# Patient Record
Sex: Male | Born: 1990
Health system: Southern US, Community
[De-identification: ages and names within clinical notes are randomized; demographics above are authoritative.]

## PROBLEM LIST (undated history)

## (undated) DIAGNOSIS — I10 Essential (primary) hypertension: Secondary | ICD-10-CM

## (undated) DIAGNOSIS — M5430 Sciatica, unspecified side: Secondary | ICD-10-CM

## (undated) DIAGNOSIS — E079 Disorder of thyroid, unspecified: Secondary | ICD-10-CM

## (undated) DIAGNOSIS — Z87442 Personal history of urinary calculi: Secondary | ICD-10-CM

## (undated) DIAGNOSIS — E119 Type 2 diabetes mellitus without complications: Secondary | ICD-10-CM

## (undated) DIAGNOSIS — E7800 Pure hypercholesterolemia, unspecified: Secondary | ICD-10-CM

## (undated) DIAGNOSIS — G4730 Sleep apnea, unspecified: Secondary | ICD-10-CM

## (undated) DIAGNOSIS — F32A Depression, unspecified: Secondary | ICD-10-CM

## (undated) DIAGNOSIS — E039 Hypothyroidism, unspecified: Secondary | ICD-10-CM

## (undated) HISTORY — PX: APPENDECTOMY: SHX54

## (undated) HISTORY — DX: Disorder of thyroid, unspecified: E07.9

---
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
## 2012-12-07 ENCOUNTER — Emergency Department (HOSPITAL_COMMUNITY): Payer: Self-pay

## 2012-12-07 ENCOUNTER — Observation Stay (HOSPITAL_COMMUNITY)
Admission: EM | Admit: 2012-12-07 | Discharge: 2012-12-08 | Disposition: A | Discharge status: Home / Self Care | Payer: Self-pay | Attending: General Surgery | Admitting: General Surgery

## 2012-12-07 ENCOUNTER — Encounter (HOSPITAL_COMMUNITY): Payer: Self-pay | Admitting: Emergency Medicine

## 2012-12-07 ENCOUNTER — Encounter (HOSPITAL_COMMUNITY)
Admission: EM | Disposition: A | Discharge status: Home / Self Care | Payer: Self-pay | Source: Home / Self Care | Attending: Emergency Medicine

## 2012-12-07 ENCOUNTER — Observation Stay (HOSPITAL_COMMUNITY): Payer: Self-pay | Admitting: Anesthesiology

## 2012-12-07 ENCOUNTER — Encounter (HOSPITAL_COMMUNITY): Payer: Self-pay | Admitting: Anesthesiology

## 2012-12-07 DIAGNOSIS — Z9049 Acquired absence of other specified parts of digestive tract: Secondary | ICD-10-CM

## 2012-12-07 DIAGNOSIS — K3580 Unspecified acute appendicitis: Secondary | ICD-10-CM

## 2012-12-07 DIAGNOSIS — R112 Nausea with vomiting, unspecified: Secondary | ICD-10-CM | POA: Insufficient documentation

## 2012-12-07 DIAGNOSIS — K35209 Acute appendicitis with generalized peritonitis, without abscess, unspecified as to perforation: Principal | ICD-10-CM | POA: Insufficient documentation

## 2012-12-07 DIAGNOSIS — K352 Acute appendicitis with generalized peritonitis, without abscess: Secondary | ICD-10-CM | POA: Insufficient documentation

## 2012-12-07 DIAGNOSIS — F17200 Nicotine dependence, unspecified, uncomplicated: Secondary | ICD-10-CM | POA: Insufficient documentation

## 2012-12-07 HISTORY — PX: LAPAROSCOPIC APPENDECTOMY: SHX408

## 2012-12-07 LAB — URINE MICROSCOPIC-ADD ON

## 2012-12-07 LAB — COMPREHENSIVE METABOLIC PANEL
ALT: 23 U/L (ref 0–53)
Alkaline Phosphatase: 76 U/L (ref 39–117)
BUN: 14 mg/dL (ref 6–23)
CO2: 25 mEq/L (ref 19–32)
Chloride: 103 mEq/L (ref 96–112)
GFR calc Af Amer: >90 mL/min (ref >90)
GFR calc non Af Amer: >90 mL/min (ref >90)
Glucose, Bld: 105 mg/dL — ABNORMAL HIGH (ref 70–99)
Potassium: 3.4 mEq/L — ABNORMAL LOW (ref 3.5–5.1)
Sodium: 139 mEq/L (ref 135–145)
Total Bilirubin: 0.5 mg/dL (ref 0.3–1.2)
Total Protein: 7 g/dL (ref 6.0–8.3)

## 2012-12-07 LAB — CBC WITH DIFFERENTIAL/PLATELET
Eosinophils Absolute: 0.1 10*3/uL (ref 0.0–0.7)
Hemoglobin: 14.7 g/dL (ref 13.0–17.0)
Lymphocytes Relative: 10 % — ABNORMAL LOW (ref 12–46)
Lymphs Abs: 1.8 10*3/uL (ref 0.7–4.0)
MCH: 31.7 pg (ref 26.0–34.0)
Monocytes Relative: 8 % (ref 3–12)
Neutrophils Relative %: 82 % — ABNORMAL HIGH (ref 43–77)
Platelets: 267 10*3/uL (ref 150–400)
RBC: 4.63 MIL/uL (ref 4.22–5.81)
WBC: 18.6 10*3/uL — ABNORMAL HIGH (ref 4.0–10.5)

## 2012-12-07 LAB — URINALYSIS, ROUTINE W REFLEX MICROSCOPIC
Bilirubin Urine: NEGATIVE
Hgb urine dipstick: NEGATIVE
Ketones, ur: NEGATIVE mg/dL
Nitrite: NEGATIVE
Protein, ur: NEGATIVE mg/dL
Specific Gravity, Urine: 1.015 (ref 1.005–1.030)
Urobilinogen, UA: 0.2 mg/dL (ref 0.0–1.0)

## 2012-12-07 LAB — LIPASE, BLOOD: Lipase: 19 U/L (ref 11–59)

## 2012-12-07 SURGERY — APPENDECTOMY, LAPAROSCOPIC
Anesthesia: General | Site: Abdomen | Wound class: Contaminated

## 2012-12-07 MED ORDER — SODIUM CHLORIDE 0.9 % IV SOLN
3.0000 g | Freq: Four times a day (QID) | INTRAVENOUS | Status: DC
  Administered 2012-12-07 – 2012-12-08 (×5): 3 g via INTRAVENOUS
  Filled 2012-12-07 (×7): qty 3

## 2012-12-07 MED ORDER — HYDROMORPHONE HCL PF 1 MG/ML IJ SOLN
INTRAMUSCULAR | Status: AC
  Administered 2012-12-07: 0.5 mg via INTRAVENOUS
  Filled 2012-12-07: qty 1

## 2012-12-07 MED ORDER — IOHEXOL 300 MG/ML  SOLN
25.0000 mL | INTRAMUSCULAR | Status: DC
  Administered 2012-12-07: 25 mL via ORAL

## 2012-12-07 MED ORDER — PROMETHAZINE HCL 25 MG/ML IJ SOLN: 6.2500 mg | INTRAMUSCULAR | Status: DC | PRN

## 2012-12-07 MED ORDER — MIDAZOLAM HCL 5 MG/5ML IJ SOLN
INTRAMUSCULAR | Status: DC | PRN
  Administered 2012-12-07: 1 mg via INTRAVENOUS

## 2012-12-07 MED ORDER — BUPIVACAINE-EPINEPHRINE PF 0.25-1:200000 % IJ SOLN
INTRAMUSCULAR | Status: AC
  Filled 2012-12-07: qty 30

## 2012-12-07 MED ORDER — ENOXAPARIN SODIUM 40 MG/0.4ML ~~LOC~~ SOLN
40.0000 mg | SUBCUTANEOUS | Status: DC
  Administered 2012-12-07: 40 mg via SUBCUTANEOUS
  Filled 2012-12-07 (×2): qty 0.4

## 2012-12-07 MED ORDER — 0.9 % SODIUM CHLORIDE (POUR BTL) OPTIME
TOPICAL | Status: DC | PRN
  Administered 2012-12-07: 1000 mL

## 2012-12-07 MED ORDER — ROCURONIUM BROMIDE 100 MG/10ML IV SOLN
INTRAVENOUS | Status: DC | PRN
  Administered 2012-12-07: 40 mg via INTRAVENOUS

## 2012-12-07 MED ORDER — ALBUTEROL SULFATE HFA 108 (90 BASE) MCG/ACT IN AERS
INHALATION_SPRAY | RESPIRATORY_TRACT | Status: DC | PRN
  Administered 2012-12-07: 2 via RESPIRATORY_TRACT

## 2012-12-07 MED ORDER — ONDANSETRON HCL 4 MG PO TABS: 4.0000 mg | ORAL_TABLET | Freq: Four times a day (QID) | ORAL | Status: DC | PRN

## 2012-12-07 MED ORDER — IBUPROFEN 600 MG PO TABS
600.0000 mg | ORAL_TABLET | Freq: Four times a day (QID) | ORAL | Status: DC | PRN
  Filled 2012-12-07: qty 1

## 2012-12-07 MED ORDER — LACTATED RINGERS IV SOLN
INTRAVENOUS | Status: DC | PRN
  Administered 2012-12-07: 10:00:00 via INTRAVENOUS

## 2012-12-07 MED ORDER — MORPHINE SULFATE 4 MG/ML IJ SOLN
4.0000 mg | Freq: Once | INTRAMUSCULAR | Status: AC
  Administered 2012-12-07: 4 mg via INTRAVENOUS
  Filled 2012-12-07: qty 1

## 2012-12-07 MED ORDER — ONDANSETRON HCL 4 MG/2ML IJ SOLN
INTRAMUSCULAR | Status: DC | PRN
  Administered 2012-12-07: 4 mg via INTRAVENOUS

## 2012-12-07 MED ORDER — MORPHINE SULFATE 2 MG/ML IJ SOLN: 1.0000 mg | INTRAMUSCULAR | Status: DC | PRN

## 2012-12-07 MED ORDER — OXYCODONE HCL 5 MG PO TABS: 5.0000 mg | ORAL_TABLET | Freq: Once | ORAL | Status: DC | PRN

## 2012-12-07 MED ORDER — HYDROMORPHONE HCL PF 1 MG/ML IJ SOLN: 0.2500 mg | INTRAMUSCULAR | Status: DC | PRN

## 2012-12-07 MED ORDER — FENTANYL CITRATE 0.05 MG/ML IJ SOLN
INTRAMUSCULAR | Status: DC | PRN
  Administered 2012-12-07: 250 ug via INTRAVENOUS

## 2012-12-07 MED ORDER — IOHEXOL 300 MG/ML  SOLN
100.0000 mL | Freq: Once | INTRAMUSCULAR | Status: AC | PRN
  Administered 2012-12-07: 100 mL via INTRAVENOUS

## 2012-12-07 MED ORDER — ONDANSETRON HCL 4 MG/2ML IJ SOLN
4.0000 mg | Freq: Once | INTRAMUSCULAR | Status: AC
  Administered 2012-12-07: 4 mg via INTRAVENOUS
  Filled 2012-12-07: qty 2

## 2012-12-07 MED ORDER — OXYCODONE HCL 5 MG/5ML PO SOLN: 5.0000 mg | Freq: Once | ORAL | Status: DC | PRN

## 2012-12-07 MED ORDER — ONDANSETRON HCL 4 MG/2ML IJ SOLN: 4.0000 mg | Freq: Four times a day (QID) | INTRAMUSCULAR | Status: DC | PRN

## 2012-12-07 MED ORDER — KCL IN DEXTROSE-NACL 20-5-0.45 MEQ/L-%-% IV SOLN
INTRAVENOUS | Status: DC
  Administered 2012-12-07: 21:00:00 via INTRAVENOUS
  Filled 2012-12-07 (×3): qty 1000

## 2012-12-07 MED ORDER — SUCCINYLCHOLINE CHLORIDE 20 MG/ML IJ SOLN
INTRAMUSCULAR | Status: DC | PRN
  Administered 2012-12-07: 100 mg via INTRAVENOUS

## 2012-12-07 MED ORDER — HYDROMORPHONE HCL PF 1 MG/ML IJ SOLN
0.2500 mg | INTRAMUSCULAR | Status: DC | PRN
  Administered 2012-12-07: 0.5 mg via INTRAVENOUS

## 2012-12-07 MED ORDER — LIDOCAINE HCL (CARDIAC) 20 MG/ML IV SOLN
INTRAVENOUS | Status: DC | PRN
  Administered 2012-12-07: 100 mg via INTRAVENOUS

## 2012-12-07 MED ORDER — OXYCODONE-ACETAMINOPHEN 5-325 MG PO TABS
1.0000 | ORAL_TABLET | ORAL | Status: DC | PRN
  Administered 2012-12-08 (×2): 2 via ORAL
  Filled 2012-12-07 (×2): qty 2

## 2012-12-07 MED ORDER — DEXTROSE-NACL 5-0.9 % IV SOLN: INTRAVENOUS | Status: DC

## 2012-12-07 MED ORDER — DEXAMETHASONE SODIUM PHOSPHATE 10 MG/ML IJ SOLN
INTRAMUSCULAR | Status: DC | PRN
  Administered 2012-12-07: 4 mg via INTRAVENOUS

## 2012-12-07 MED ORDER — HYDROMORPHONE HCL PF 1 MG/ML IJ SOLN
1.0000 mg | INTRAMUSCULAR | Status: DC | PRN
  Administered 2012-12-07 (×2): 1 mg via INTRAVENOUS
  Filled 2012-12-07 (×2): qty 1

## 2012-12-07 MED ORDER — NEOSTIGMINE METHYLSULFATE 1 MG/ML IJ SOLN
INTRAMUSCULAR | Status: DC | PRN
  Administered 2012-12-07: 3 mg via INTRAVENOUS

## 2012-12-07 MED ORDER — PROPOFOL 10 MG/ML IV BOLUS
INTRAVENOUS | Status: DC | PRN
  Administered 2012-12-07: 200 mg via INTRAVENOUS

## 2012-12-07 MED ORDER — GLYCOPYRROLATE 0.2 MG/ML IJ SOLN
INTRAMUSCULAR | Status: DC | PRN
  Administered 2012-12-07: 0.4 mg via INTRAVENOUS

## 2012-12-07 MED ORDER — BUPIVACAINE-EPINEPHRINE 0.25% -1:200000 IJ SOLN
INTRAMUSCULAR | Status: DC | PRN
  Administered 2012-12-07: 21 mL

## 2012-12-07 SURGICAL SUPPLY — 48 items
APPLIER CLIP ROT 10 11.4 M/L (STAPLE)
BANDAGE ADHESIVE 1X3 (GAUZE/BANDAGES/DRESSINGS) ×2 IMPLANT
BENZOIN TINCTURE PRP APPL 2/3 (GAUZE/BANDAGES/DRESSINGS) IMPLANT
BLADE CLIPPER SURG NEURO (BLADE) ×2 IMPLANT
CANISTER SUCTION 2500CC (MISCELLANEOUS) ×2 IMPLANT
CHLORAPREP W/TINT 26ML (MISCELLANEOUS) ×2 IMPLANT
CLIP APPLIE ROT 10 11.4 M/L (STAPLE) IMPLANT
CLOTH BEACON ORANGE TIMEOUT ST (SAFETY) ×2 IMPLANT
COVER SURGICAL LIGHT HANDLE (MISCELLANEOUS) ×2 IMPLANT
CUTTER FLEX LINEAR 45M (STAPLE) ×2 IMPLANT
DECANTER SPIKE VIAL GLASS SM (MISCELLANEOUS) ×2 IMPLANT
DERMABOND ADVANCED (GAUZE/BANDAGES/DRESSINGS)
DERMABOND ADVANCED .7 DNX12 (GAUZE/BANDAGES/DRESSINGS) IMPLANT
DRAPE UTILITY 15X26 W/TAPE STR (DRAPE) ×4 IMPLANT
DRSG TEGADERM 2-3/8X2-3/4 SM (GAUZE/BANDAGES/DRESSINGS) ×2 IMPLANT
ELECT REM PT RETURN 9FT ADLT (ELECTROSURGICAL) ×2
ELECTRODE REM PT RTRN 9FT ADLT (ELECTROSURGICAL) ×1 IMPLANT
ENDOLOOP SUT PDS II  0 18 (SUTURE)
ENDOLOOP SUT PDS II 0 18 (SUTURE) IMPLANT
GAUZE SPONGE 2X2 8PLY STRL LF (GAUZE/BANDAGES/DRESSINGS) ×1 IMPLANT
GLOVE BIOGEL M STRL SZ7.5 (GLOVE) ×2 IMPLANT
GLOVE BIOGEL PI IND STRL 7.0 (GLOVE) ×1 IMPLANT
GLOVE BIOGEL PI IND STRL 8 (GLOVE) ×1 IMPLANT
GLOVE BIOGEL PI INDICATOR 7.0 (GLOVE) ×1
GLOVE BIOGEL PI INDICATOR 8 (GLOVE) ×1
GLOVE SURG SS PI 7.0 STRL IVOR (GLOVE) ×2 IMPLANT
GOWN STRL NON-REIN LRG LVL3 (GOWN DISPOSABLE) ×4 IMPLANT
GOWN STRL REIN XL XLG (GOWN DISPOSABLE) ×2 IMPLANT
KIT BASIN OR (CUSTOM PROCEDURE TRAY) ×2 IMPLANT
KIT ROOM TURNOVER OR (KITS) ×2 IMPLANT
NS IRRIG 1000ML POUR BTL (IV SOLUTION) ×2 IMPLANT
PAD ARMBOARD 7.5X6 YLW CONV (MISCELLANEOUS) ×4 IMPLANT
POUCH SPECIMEN RETRIEVAL 10MM (ENDOMECHANICALS) ×2 IMPLANT
RELOAD 45 VASCULAR/THIN (ENDOMECHANICALS) ×2 IMPLANT
RELOAD STAPLE TA45 3.5 REG BLU (ENDOMECHANICALS) IMPLANT
SCALPEL HARMONIC ACE (MISCELLANEOUS) ×2 IMPLANT
SCISSORS LAP 5X35 DISP (ENDOMECHANICALS) IMPLANT
SET IRRIG TUBING LAPAROSCOPIC (IRRIGATION / IRRIGATOR) ×2 IMPLANT
SPECIMEN JAR SMALL (MISCELLANEOUS) ×2 IMPLANT
SPONGE GAUZE 2X2 STER 10/PKG (GAUZE/BANDAGES/DRESSINGS) ×1
SUT MNCRL AB 4-0 PS2 18 (SUTURE) ×2 IMPLANT
SUT VICRYL 0 UR6 27IN ABS (SUTURE) IMPLANT
TOWEL OR 17X24 6PK STRL BLUE (TOWEL DISPOSABLE) ×2 IMPLANT
TOWEL OR 17X26 10 PK STRL BLUE (TOWEL DISPOSABLE) ×2 IMPLANT
TRAY FOLEY CATH 16FR SILVER (SET/KITS/TRAYS/PACK) ×2 IMPLANT
TRAY LAPAROSCOPIC (CUSTOM PROCEDURE TRAY) ×2 IMPLANT
TROCAR XCEL BLADELESS 5X75MML (TROCAR) ×4 IMPLANT
TROCAR XCEL BLUNT TIP 100MML (ENDOMECHANICALS) ×2 IMPLANT

## 2012-12-07 NOTE — ED Notes (Signed)
Called CT personnel.  Advised that several pt's needed to be dosed and that there is a staff member coming around with contrast.

## 2012-12-07 NOTE — ED Provider Notes (Signed)
CSN: [id number]     Arrival date & time 12/07/12  0218 History   First MD Initiated Contact with Patient 12/07/12 [phone]     Chief Complaint  Patient presents with  . Abdominal Pain   (Consider location/radiation/quality/duration/timing/severity/associated sxs/prior Treatment) HPI Comments: Patient presents to the ER for abdominal pain. Patient reports that he was awakened from sleep around 1 AM with complaints of pain in his abdomen. Patient noticed pain in the right lower abdomen which has progressively worsened. She has had nausea and vomited one time. He has not noticed any fever. Patient has no diarrhea. Patient indicates that he had increased pain with jostling movements in the car on the way to the ER.  Patient is a 22 y.o. male presenting with abdominal pain.  Abdominal Pain Associated symptoms: vomiting     History reviewed. No pertinent past medical history. History reviewed. No pertinent past surgical history. No family history on file. History  Substance Use Topics  . Smoking status: Current Every Day Smoker  . Smokeless tobacco: Not on file  . Alcohol Use: Yes    Review of Systems  Gastrointestinal: Positive for vomiting and abdominal pain.  All other systems reviewed and are negative.    Allergies  Review of patient's allergies indicates no known allergies.  Home Medications   Current Outpatient Rx  Name  Route  Sig  Dispense  Refill  . naproxen sodium (ANAPROX) 220 MG tablet   Oral   Take 220 mg by mouth as needed (pain).          BP 132/74  Pulse 90  Temp(Src) 97.8 F (36.6 C) (Oral)  Resp 20  Ht 5\' 9"  (1.753 m)  Wt 200 lb (90.719 kg)  BMI 29.52 kg/m2  SpO2 100% Physical Exam  Constitutional: He is oriented to person, place, and time. He appears well-developed and well-nourished. No distress.  HENT:  Head: Normocephalic and atraumatic.  Right Ear: Hearing normal.  Left Ear: Hearing normal.  Nose: Nose normal.  Mouth/Throat: Oropharynx is  clear and moist and mucous membranes are normal.  Eyes: Conjunctivae and EOM are normal. Pupils are equal, round, and reactive to light.  Neck: Normal range of motion. Neck supple.  Cardiovascular: Regular rhythm, S1 normal and S2 normal.  Exam reveals no gallop and no friction rub.   No murmur heard. Pulmonary/Chest: Effort normal and breath sounds normal. No respiratory distress. He exhibits no tenderness.  Abdominal: Soft. Normal appearance and bowel sounds are normal. There is no hepatosplenomegaly. There is tenderness in the right lower quadrant. There is rebound and guarding. There is no tenderness at McBurney's point and negative Murphy's sign. No hernia.  Rovsing  Musculoskeletal: Normal range of motion.  Neurological: He is alert and oriented to person, place, and time. He has normal strength. No cranial nerve deficit or sensory deficit. Coordination normal. GCS eye subscore is 4. GCS verbal subscore is 5. GCS motor subscore is 6.  Skin: Skin is warm, dry and intact. No rash noted. No cyanosis.  Psychiatric: He has a normal mood and affect. His speech is normal and behavior is normal. Thought content normal.    ED Course  Procedures (including critical care time) Labs Review Labs Reviewed  CBC WITH DIFFERENTIAL - Abnormal; Notable for the following:    WBC 18.6 (*)    Neutrophils Relative % 82 (*)    Neutro Abs 15.2 (*)    Lymphocytes Relative 10 (*)    Monocytes Absolute 1.4 (*)  All other components within normal limits  COMPREHENSIVE METABOLIC PANEL - Abnormal; Notable for the following:    Potassium 3.4 (*)    Glucose, Bld 105 (*)    All other components within normal limits  LIPASE, BLOOD  URINALYSIS, ROUTINE W REFLEX MICROSCOPIC   Imaging Review Ct Abdomen Pelvis W Contrast  12/07/2012   CLINICAL DATA:  Right lower quadrant pain the  EXAM: CT ABDOMEN AND PELVIS WITH CONTRAST  TECHNIQUE: Multidetector CT imaging of the abdomen and pelvis was performed using the  standard protocol following bolus administration of intravenous contrast.  CONTRAST:  OMNIPAQUE IOHEXOL 300 MG/ML  SOLN  COMPARISON:  None.  FINDINGS: BODY WALL: Unremarkable.  LOWER CHEST:  Mediastinum: Unremarkable.  Lungs/pleura: No consolidation.  ABDOMEN/PELVIS:  Liver: No focal abnormality.  Biliary: No evidence of biliary obstruction or stone.  Pancreas: Unremarkable.  Spleen: Unremarkable.  Adrenals: Unremarkable.  Kidneys and ureters: No hydronephrosis or stone.  Bladder: Unremarkable.  Bowel: The appendix is mildly dilated up to 8 mm, with avid wall enhancement and periappendiceal fat stranding. Small appendicolith present distally. No evidence of necrosis or perforation.  Retroperitoneum: No mass or adenopathy.  Peritoneum: Small volume free pelvic fluid, considered reactive.  Reproductive: Unremarkable.  Vascular: No acute abnormality.  OSSEOUS: No acute abnormalities. No suspicious lytic or blastic lesions.  CriticalValue/emergent results were called by telephone at the time of interpretation on 12/07/2012 at 5:36 [facility] , who verbally acknowledged these results.  IMPRESSION: Acute appendicitis.  No perforation.   Electronically Signed   By: Tiburcio Pea   On: 12/07/2012 05:37    MDM  Diagnosis: Acute appendicitis  Patient presents to the ER for evaluation of right-sided abdominal pain. Symptoms began approximately 1 AM and progressively worsened. Examination revealed significant tenderness and guarding in the right lower quadrant. He had mild rebound and a Rovsing. Acute appendicitis was considered the most likely diagnosis, CAT scan to confirm the diagnosis. General surgery consulted, will see the patient in the ER.    Gilda Crease, MD 12/07/12 [phone]

## 2012-12-07 NOTE — H&P (Signed)
Jordan Barnett is an 22 y.o. male.   Chief Complaint: RLQ abdominal pain HPI: 1 day hx of abdominal pain,  Location RLQ.  Intensity severe sharp 8/10 constant.  Made worse with movement.  Nausea.  Feels feverish.  CT acute appendicitis.   History reviewed. No pertinent past medical history.  History reviewed. No pertinent past surgical history.  No family history on file. Social History:  reports that he has been smoking.  He does not have any smokeless tobacco history on file. He reports that  drinks alcohol. He reports that he does not use illicit drugs.  Allergies: No Known Allergies   (Not in a hospital admission)  Results for orders placed during the hospital encounter of 12/07/12 (from the past 48 hour(s))  CBC WITH DIFFERENTIAL     Status: Abnormal   Collection Time    12/07/12  2:30 AM      Result Value Range   WBC 18.6 (*) 4.0 - 10.5 K/uL   RBC 4.63  4.22 - 5.81 MIL/uL   Hemoglobin 14.7  13.0 - 17.0 g/dL   HCT 40.9  81.1 - 91.4 %   MCV 90.1  78.0 - 100.0 fL   MCH 31.7  26.0 - 34.0 pg   MCHC 35.3  30.0 - 36.0 g/dL   RDW 78.2  95.6 - 21.3 %   Platelets 267  150 - 400 K/uL   Neutrophils Relative % 82 (*) 43 - 77 %   Neutro Abs 15.2 (*) 1.7 - 7.7 K/uL   Lymphocytes Relative 10 (*) 12 - 46 %   Lymphs Abs 1.8  0.7 - 4.0 K/uL   Monocytes Relative 8  3 - 12 %   Monocytes Absolute 1.4 (*) 0.1 - 1.0 K/uL   Eosinophils Relative 1  0 - 5 %   Eosinophils Absolute 0.1  0.0 - 0.7 K/uL   Basophils Relative 0  0 - 1 %   Basophils Absolute 0.0  0.0 - 0.1 K/uL  COMPREHENSIVE METABOLIC PANEL     Status: Abnormal   Collection Time    12/07/12  2:30 AM      Result Value Range   Sodium 139  135 - 145 mEq/L   Potassium 3.4 (*) 3.5 - 5.1 mEq/L   Chloride 103  96 - 112 mEq/L   CO2 25  19 - 32 mEq/L   Glucose, Bld 105 (*) 70 - 99 mg/dL   BUN 14  6 - 23 mg/dL   Creatinine, Ser 0.86  0.50 - 1.35 mg/dL   Calcium 8.7  8.4 - 57.8 mg/dL   Total Protein 7.0  6.0 - 8.3 g/dL   Albumin 4.3   3.5 - 5.2 g/dL   AST 20  0 - 37 U/L   ALT 23  0 - 53 U/L   Alkaline Phosphatase 76  39 - 117 U/L   Total Bilirubin 0.5  0.3 - 1.2 mg/dL   GFR calc non Af Amer >90  >90 mL/min   GFR calc Af Amer >90  >90 mL/min   Comment: (NOTE)     The eGFR has been calculated using the CKD EPI equation.     This calculation has not been validated in all clinical situations.     eGFR's persistently <90 mL/min signify possible Chronic Kidney     Disease.  LIPASE, BLOOD     Status: None   Collection Time    12/07/12  2:30 AM      Result Value Range  Lipase 19  11 - 59 U/L  URINALYSIS, ROUTINE W REFLEX MICROSCOPIC     Status: Abnormal   Collection Time    12/07/12  5:13 AM      Result Value Range   Color, Urine YELLOW  YELLOW   APPearance CLEAR  CLEAR   Specific Gravity, Urine 1.015  1.005 - 1.030   pH 5.5  5.0 - 8.0   Glucose, UA NEGATIVE  NEGATIVE mg/dL   Hgb urine dipstick NEGATIVE  NEGATIVE   Bilirubin Urine NEGATIVE  NEGATIVE   Ketones, ur NEGATIVE  NEGATIVE mg/dL   Protein, ur NEGATIVE  NEGATIVE mg/dL   Urobilinogen, UA 0.2  0.0 - 1.0 mg/dL   Nitrite NEGATIVE  NEGATIVE   Leukocytes, UA TRACE (*) NEGATIVE  URINE MICROSCOPIC-ADD ON     Status: None   Collection Time    12/07/12  5:13 AM      Result Value Range   Squamous Epithelial / LPF RARE  RARE   WBC, UA 0-2  <3 WBC/hpf   Bacteria, UA RARE  RARE   Ct Abdomen Pelvis W Contrast  12/07/2012   CLINICAL DATA:  Right lower quadrant pain the  EXAM: CT ABDOMEN AND PELVIS WITH CONTRAST  TECHNIQUE: Multidetector CT imaging of the abdomen and pelvis was performed using the standard protocol following bolus administration of intravenous contrast.  CONTRAST:  OMNIPAQUE IOHEXOL 300 MG/ML  SOLN  COMPARISON:  None.  FINDINGS: BODY WALL: Unremarkable.  LOWER CHEST:  Mediastinum: Unremarkable.  Lungs/pleura: No consolidation.  ABDOMEN/PELVIS:  Liver: No focal abnormality.  Biliary: No evidence of biliary obstruction or stone.  Pancreas:  Unremarkable.  Spleen: Unremarkable.  Adrenals: Unremarkable.  Kidneys and ureters: No hydronephrosis or stone.  Bladder: Unremarkable.  Bowel: The appendix is mildly dilated up to 8 mm, with avid wall enhancement and periappendiceal fat stranding. Small appendicolith present distally. No evidence of necrosis or perforation.  Retroperitoneum: No mass or adenopathy.  Peritoneum: Small volume free pelvic fluid, considered reactive.  Reproductive: Unremarkable.  Vascular: No acute abnormality.  OSSEOUS: No acute abnormalities. No suspicious lytic or blastic lesions.  CriticalValue/emergent results were called by telephone at the time of interpretation on 12/07/2012 at 5:36 [facility] , who verbally acknowledged these results.  IMPRESSION: Acute appendicitis.  No perforation.   Electronically Signed   By: Tiburcio Pea   On: 12/07/2012 05:37    Review of Systems  Constitutional: Positive for fever and chills.  HENT: Negative.   Eyes: Negative.   Cardiovascular: Negative.   Gastrointestinal: Positive for nausea and abdominal pain.  Genitourinary: Negative.   Musculoskeletal: Negative.   Skin: Negative.   Neurological: Positive for weakness.  Endo/Heme/Allergies: Negative.   Psychiatric/Behavioral: Negative.     Blood pressure 128/69, pulse 73, temperature 97.8 F (36.6 C), temperature source Oral, resp. rate 20, height 5\' 9"  (1.753 m), weight 200 lb (90.719 kg), SpO2 99.00%. Physical Exam  Constitutional: He is oriented to person, place, and time. He appears well-developed and well-nourished.  HENT:  Head: Normocephalic and atraumatic.  Eyes: EOM are normal. Pupils are equal, round, and reactive to light.  Neck: Normal range of motion. Neck supple.  Cardiovascular: Normal rate and regular rhythm.   Respiratory: Effort normal and breath sounds normal.  GI: He exhibits no distension. There is tenderness in the right lower quadrant. There is rebound and tenderness at McBurney's  point. No hernia.  Musculoskeletal: Normal range of motion.  Neurological: He is alert and oriented to person, place,  and time.  Skin: Skin is warm and dry.  Psychiatric: He has a normal mood and affect. His behavior is normal. Judgment and thought content normal.     Assessment/Plan Acute appendicitis Admit / IVF/ABX   Dr Andrey Campanile to see and decide on timing of surgery.  Given tenderness,  Laparoscopic appendectomy best choice over medical management. Discussed with patient and family.  Jordan Barnett A. 12/07/2012, 6:28 AM

## 2012-12-07 NOTE — Op Note (Signed)
Appendectomy, Lap, Procedure Note  Indications: The patient presented with a history of right-sided abdominal pain. A CT revealed findings consistent with acute appendicitis.  Pre-operative Diagnosis: Acute appendicitis with generalized peritonitis  Post-operative Diagnosis: Same  Surgeon: Atilano Ina   Assistants: none  Anesthesia: General endotracheal anesthesia  ASA Class: 1, E  Procedure Details  The patient was seen again in the Holding Room. The risks, benefits, complications, treatment options, and expected outcomes were discussed with the patient and/or family. The possibilities of perforation of viscus, bleeding, recurrent infection, the need for additional procedures, failure to diagnose a condition, and creating a complication requiring transfusion or operation were discussed. There was concurrence with the proposed plan and informed consent was obtained. The site of surgery was properly noted. The patient was taken to Operating Room, identified as Giordan Fordham and the procedure verified as Appendectomy. A Time Out was held and the above information confirmed.  The patient was placed in the supine position and general anesthesia was induced, along with placement of orogastric tube, SCDs, and a Foley catheter. The abdomen was prepped and draped in a sterile fashion. A 1.5 centimeter infraumbilical incision was made.  The umbilical stalk was elevated, and the midline fascia was incised with a #11 blade.  A Kelly clamp was used to confirm entrance into the peritoneal cavity.  A pursestring suture was passed around the incision with a 0 Vicryl.  A 12mm Hasson was introduced into the abdomen and the tails of the suture were used to hold the Hasson in place.   The pneumoperitoneum was then established to steady pressure of 15 mmHg.  Additional 5 mm cannulas then placed in the left lower quadrant of the abdomen and the suprapubic region under direct visualization. A careful evaluation of  the entire abdomen was carried out. The patient was placed in Trendelenburg and left lateral decubitus position. The small intestines were retracted in the cephalad and left lateral direction away from the pelvis and right lower quadrant. The patient was found to have an inflammed appendix that was extending into the pelvis. There was no evidence of perforation.  The appendix was carefully dissected. The appendix was was skeletonized with the harmonic scalpel.   The appendix was divided at its base using an endo-GIA stapler with a white load. No appendiceal stump was left in place. The appendix was removed from the abdomen with an Endocatch bag through the umbilical port.  There was no evidence of bleeding, leakage, or complication after division of the appendix. Irrigation was also performed and irrigate suctioned from the abdomen as well.  The umbilical port site was closed with the purse string suture. The closure was viewed laparoscopically. There was no residual palpable fascial defect.  The trocar site skin wounds were closed with 4-0 Monocryl. Benzoin, steri-strips, band-aids were applied. 2x2 gauze and tegaderm applied at umbilical incision.  Instrument, sponge, and needle counts were correct at the conclusion of the case.   Findings: The appendix was found to be inflamed. There were not signs of necrosis.  There was not perforation. There was not abscess formation.  Estimated Blood Loss:  Minimal         Drains: none         Specimens: appendix         Complications:  None; patient tolerated the procedure well.         Disposition: PACU - hemodynamically stable.         Condition: stable  Mary Sella.  Redmond Pulling, MD, FACS General, Bariatric, & Minimally Invasive Surgery [facility] Surgery, [place]

## 2012-12-07 NOTE — Anesthesia Postprocedure Evaluation (Signed)
Anesthesia Post Note  Patient: Jordan Barnett  Procedure(s) Performed: Procedure(s) (LRB): APPENDECTOMY LAPAROSCOPIC (N/A)  Anesthesia type: general  Patient location: PACU  Post pain: Pain level controlled  Post assessment: Patient's Cardiovascular Status Stable  Last Vitals:  Filed Vitals:   12/07/12 1230  BP: 105/55  Pulse: 63  Temp:   Resp: 19    Post vital signs: Reviewed and stable  Level of consciousness: sedated  Complications: No apparent anesthesia complications

## 2012-12-07 NOTE — Progress Notes (Signed)
Day of Surgery  Subjective: <1 day h/o abd pain, RLQ, n/v. Came to ED to get checked out. No prior history. O/w healthy. No prior surgery. +tob  Objective: Vital signs in last 24 hours: Temp:  [97.8 F (36.6 C)-98.4 F (36.9 C)] 98.4 F (36.9 C) (09/24 0805) Pulse Rate:  [57-90] 57 (09/24 0805) Resp:  [18-20] 18 (09/24 0805) BP: (116-147)/(59-81) 139/59 mmHg (09/24 0805) SpO2:  [96 %-100 %] 99 % (09/24 0805) Weight:  [200 lb (90.719 kg)] 200 lb (90.719 kg) (09/24 0223) Last BM Date: 12/06/12  Intake/Output from previous day:   Intake/Output this shift:    Alert, nad cta  Reg Pudgy, firm, RLQ & Suprapubic TTP. Localized peritonitis  Lab Results:   Recent Labs  12/07/12 0230  WBC 18.6*  HGB 14.7  HCT 41.7  PLT 267   BMET  Recent Labs  12/07/12 0230  NA 139  K 3.4*  CL 103  CO2 25  GLUCOSE 105*  BUN 14  CREATININE 1.06  CALCIUM 8.7   PT/INR No results found for this basename: LABPROT, INR,  in the last 72 hours ABG No results found for this basename: PHART, PCO2, PO2, HCO3,  in the last 72 hours  Studies/Results: Ct Abdomen Pelvis W Contrast  12/07/2012   CLINICAL DATA:  Right lower quadrant pain the  EXAM: CT ABDOMEN AND PELVIS WITH CONTRAST  TECHNIQUE: Multidetector CT imaging of the abdomen and pelvis was performed using the standard protocol following bolus administration of intravenous contrast.  CONTRAST:  OMNIPAQUE IOHEXOL 300 MG/ML  SOLN  COMPARISON:  None.  FINDINGS: BODY WALL: Unremarkable.  LOWER CHEST:  Mediastinum: Unremarkable.  Lungs/pleura: No consolidation.  ABDOMEN/PELVIS:  Liver: No focal abnormality.  Biliary: No evidence of biliary obstruction or stone.  Pancreas: Unremarkable.  Spleen: Unremarkable.  Adrenals: Unremarkable.  Kidneys and ureters: No hydronephrosis or stone.  Bladder: Unremarkable.  Bowel: The appendix is mildly dilated up to 8 mm, with avid wall enhancement and periappendiceal fat stranding. Small appendicolith present  distally. No evidence of necrosis or perforation.  Retroperitoneum: No mass or adenopathy.  Peritoneum: Small volume free pelvic fluid, considered reactive.  Reproductive: Unremarkable.  Vascular: No acute abnormality.  OSSEOUS: No acute abnormalities. No suspicious lytic or blastic lesions.  CriticalValue/emergent results were called by telephone at the time of interpretation on 12/07/2012 at 5:36 [facility] , who verbally acknowledged these results.  IMPRESSION: Acute appendicitis.  No perforation.   Electronically Signed   By: Tiburcio Pea   On: 12/07/2012 05:37    Anti-infectives: Anti-infectives   Start     Dose/Rate Route Frequency Ordered Stop   12/07/12 0900  Ampicillin-Sulbactam (UNASYN) 3 g in sodium chloride 0.9 % 100 mL IVPB     3 g 100 mL/hr over 60 Minutes Intravenous Every 6 hours 12/07/12 0819        Assessment/Plan: Acute appendicitis  To OR for lap appy  We discussed the etiology and management of acute appendicitis. We discussed operative and nonoperative management.  I recommended operative management along with IV antibiotics.  We discussed laparoscopic appendectomy. We discussed the risk and benefits of surgery including but not limited to bleeding, infection, injury to surrounding structures, need to convert to an open procedure, blood clot formation, post operative abscess or wound infection, staple line complications such as leak or bleeding, hernia formation, post operative ileus, need for additional procedures, anesthesia complications, and the typical postoperative course. I explained that the patient should expect a good  improvement in their symptoms.  Mary Sella. Andrey Campanile, MD, FACS General, Bariatric, & Minimally Invasive Surgery [facility] Surgery, [place]   LOS: 0 days    Atilano Ina 12/07/2012

## 2012-12-07 NOTE — Transfer of Care (Signed)
Immediate Anesthesia Transfer of Care Note  Patient: Jordan Barnett  Procedure(s) Performed: Procedure(s): APPENDECTOMY LAPAROSCOPIC (N/A)  Patient Location: PACU  Anesthesia Type:General  Level of Consciousness: awake, alert  and oriented  Airway & Oxygen Therapy: Patient Spontanous Breathing  Post-op Assessment: Report given to PACU RN and Post -op Vital signs reviewed and stable  Post vital signs: Reviewed and stable  Complications: No apparent anesthesia complications

## 2012-12-07 NOTE — ED Notes (Signed)
CT notified that pt is finished with contrast.  They stated they will be here in approximately 20 minutes to get pt for scan.

## 2012-12-07 NOTE — Preoperative (Signed)
Beta Blockers   Reason not to administer Beta Blockers:Not Applicable 

## 2012-12-07 NOTE — ED Notes (Signed)
Pt. reports RLQ pain onset 1 am this morning with nausea and vomitted ( x1) , denies fever or diarrhea.

## 2012-12-07 NOTE — Anesthesia Preprocedure Evaluation (Addendum)
Anesthesia Evaluation  Patient identified by MRN, date of birth, ID band Patient awake    Reviewed: Allergy & Precautions, H&P , NPO status , Patient's Chart, lab work & pertinent test results  Airway Mallampati: II      Dental  (+) Teeth Intact   Pulmonary Current Smoker,  breath sounds clear to auscultation        Cardiovascular negative cardio ROS  Rhythm:regular Rate:Normal     Neuro/Psych negative neurological ROS  negative psych ROS   GI/Hepatic negative GI ROS, Neg liver ROS,   Endo/Other  negative endocrine ROS  Renal/GU negative Renal ROS     Musculoskeletal   Abdominal   Peds  Hematology   Anesthesia Other Findings   Reproductive/Obstetrics negative OB ROS                         Anesthesia Physical Anesthesia Plan  ASA: II and emergent  Anesthesia Plan:    Post-op Pain Management:    Induction: Intravenous  Airway Management Planned:   Additional Equipment:   Intra-op Plan:   Post-operative Plan: Extubation in OR  Informed Consent: I have reviewed the patients History and Physical, chart, labs and discussed the procedure including the risks, benefits and alternatives for the proposed anesthesia with the patient or authorized representative who has indicated his/her understanding and acceptance.   Dental Advisory Given  Plan Discussed with: CRNA, Anesthesiologist and Surgeon  Anesthesia Plan Comments:        Anesthesia Quick Evaluation

## 2012-12-08 ENCOUNTER — Encounter (HOSPITAL_COMMUNITY): Payer: Self-pay | Admitting: General Surgery

## 2012-12-08 MED ORDER — OXYCODONE-ACETAMINOPHEN 5-325 MG PO TABS: 1.0000 | ORAL_TABLET | ORAL | Status: DC | PRN

## 2012-12-08 MED ORDER — IBUPROFEN 600 MG PO TABS: 600.0000 mg | ORAL_TABLET | Freq: Four times a day (QID) | ORAL | Status: DC | PRN

## 2012-12-08 NOTE — Discharge Summary (Signed)
  Physician Discharge Summary  Patient ID: Jordan Barnett MRN: [id number] DOB/AGE: Nov 03, 1990 22 y.o.  Admit date: 12/07/2012 Discharge date: 12/08/2012  Admitting Diagnosis: Acute appendicitis   Discharge Diagnosis Patient Active Problem List   Diagnosis Date Noted  . Acute appendicitis without mention of peritonitis 12/07/2012  . S/P laparoscopic appendectomy 12/07/2012    Consultants none  Imaging: Ct Abdomen Pelvis W Contrast  12/07/2012   CLINICAL DATA:  Right lower quadrant pain the  EXAM: CT ABDOMEN AND PELVIS WITH CONTRAST  TECHNIQUE: Multidetector CT imaging of the abdomen and pelvis was performed using the standard protocol following bolus administration of intravenous contrast.  CONTRAST:  OMNIPAQUE IOHEXOL 300 MG/ML  SOLN  COMPARISON:  None.  FINDINGS: BODY WALL: Unremarkable.  LOWER CHEST:  Mediastinum: Unremarkable.  Lungs/pleura: No consolidation.  ABDOMEN/PELVIS:  Liver: No focal abnormality.  Biliary: No evidence of biliary obstruction or stone.  Pancreas: Unremarkable.  Spleen: Unremarkable.  Adrenals: Unremarkable.  Kidneys and ureters: No hydronephrosis or stone.  Bladder: Unremarkable.  Bowel: The appendix is mildly dilated up to 8 mm, with avid wall enhancement and periappendiceal fat stranding. Small appendicolith present distally. No evidence of necrosis or perforation.  Retroperitoneum: No mass or adenopathy.  Peritoneum: Small volume free pelvic fluid, considered reactive.  Reproductive: Unremarkable.  Vascular: No acute abnormality.  OSSEOUS: No acute abnormalities. No suspicious lytic or blastic lesions.  CriticalValue/emergent results were called by telephone at the time of interpretation on 12/07/2012 at 5:36 [facility] , who verbally acknowledged these results.  IMPRESSION: Acute appendicitis.  No perforation.   Electronically Signed   By: Tiburcio Pea   On: 12/07/2012 05:37    Procedures Laparoscopic Appendectomy Dr. Andrey Campanile  12/07/12  Hospital Course:  Jordan Barnett is a healthy male who presented to [facility] with RLQ abdominal pain .  Workup showed acute appendicitis.  Patient was admitted and underwent procedure listed above.  Tolerated procedure well and was transferred to the floor.  Diet was advanced as tolerated.  On POD #1, the patient was voiding well, tolerating diet, ambulating well, pain well controlled, vital signs stable, incisions c/d/i and felt stable for discharge home.  Patient will follow up in our office in 3 weeks and knows to call with questions or concerns.  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  Medication risks and benefits discussed.   Physical Exam: General:  Alert, NAD, pleasant, comfortable Abd:  Soft, ND, mild tenderness, incisions C/D/I    Medication List         ibuprofen 600 MG tablet  Commonly known as:  ADVIL,MOTRIN  Take 1 tablet (600 mg total) by mouth every 6 (six) hours as needed (mild pain).     naproxen sodium 220 MG tablet  Commonly known as:  ANAPROX  Take 220 mg by mouth as needed (pain).     oxyCODONE-acetaminophen 5-325 MG per tablet  Commonly known as:  PERCOCET/ROXICET  Take 1 tablet by mouth every 4 (four) hours as needed.             Follow-up Information   Follow up with [facility] On 01/03/2013. (arrive no later than 1:15pm for 1:45 appt )    Contact information:   [address]   [place] [address] [phone]       Signed: Ashok Norris, [facility] [phone]  12/08/2012, 11:02 AM

## 2012-12-08 NOTE — Discharge Summary (Signed)
Pt seen and examined Discussed d/c instructions  Jordan Barnett. Andrey Campanile, MD, FACS General, Bariatric, & Minimally Invasive Surgery [facility], [place]

## 2012-12-15 ENCOUNTER — Telehealth (INDEPENDENT_AMBULATORY_CARE_PROVIDER_SITE_OTHER): Payer: Self-pay

## 2012-12-15 NOTE — Telephone Encounter (Signed)
[facility] calling to verify Rx request for refill of Percocet for the patient.  No documentation to confirm request.  LMOV pt to call our office for medication refill.

## 2013-01-03 ENCOUNTER — Encounter (INDEPENDENT_AMBULATORY_CARE_PROVIDER_SITE_OTHER): Payer: Self-pay | Admitting: *Deleted

## 2013-01-03 ENCOUNTER — Encounter (INDEPENDENT_AMBULATORY_CARE_PROVIDER_SITE_OTHER): Payer: Self-pay

## 2013-01-03 ENCOUNTER — Ambulatory Visit (INDEPENDENT_AMBULATORY_CARE_PROVIDER_SITE_OTHER): Payer: Self-pay | Admitting: General Surgery

## 2013-01-03 VITALS — BP 128/86 | HR 84 | Temp 97.4°F | Resp 16 | Ht 69.0 in | Wt 199.0 lb

## 2013-01-03 DIAGNOSIS — L02219 Cutaneous abscess of trunk, unspecified: Secondary | ICD-10-CM

## 2013-01-03 DIAGNOSIS — L02214 Cutaneous abscess of groin: Secondary | ICD-10-CM

## 2013-01-03 DIAGNOSIS — B3789 Other sites of candidiasis: Secondary | ICD-10-CM

## 2013-01-03 DIAGNOSIS — K3580 Unspecified acute appendicitis: Secondary | ICD-10-CM

## 2013-01-03 MED ORDER — CEPHALEXIN 500 MG PO CAPS: 500.0000 mg | ORAL_CAPSULE | Freq: Four times a day (QID) | ORAL | Status: AC

## 2013-01-03 MED ORDER — OXYCODONE-ACETAMINOPHEN 10-650 MG PO TABS: 1.0000 | ORAL_TABLET | Freq: Four times a day (QID) | ORAL | Status: DC | PRN

## 2013-01-03 NOTE — Patient Instructions (Signed)
Remove packing on Thursday.  Do not Shower until packing removed.  Keep area clean and covered

## 2013-01-03 NOTE — Progress Notes (Signed)
AVIEN TAHA 1990-06-06 [id number] 01/03/2013   History of Present Illness: Jordan Barnett is a  22 y.o. male who presents today status post lap appy by Dr. Dr. Gaynelle Adu.  Pathology reveals acute appendicitis with serositis.  The patient has an improving appetite, having normal bowel movements, has good pain control.  He  is back to most normal activities. He c/o a lump in his right groin that started 3 days ago along with some yeast in this area as well.  He is using over the counter lotrimin cream.  No fevers or chills.   Physical Exam: Abd: soft, nontender, active bowel sounds, nondistended.  All incisions are well healed. Inguinal area with candida present.  He also has a small 2cm area of fluctuance in the right groin.  This is tender.  This area was cleaned and prepped.  8cc of 2% lidocaine with epi was used to anesthetize this area.  An 11 blade was used to make an elliptical incision.  A small amount of purulent drainage was evacuated.  Hemostasis was achieved and iodoform packing was placed.  The patient tolerated the procedure well.   Impression: 1.  Acute appendicitis, s/p lap appy 2. Right inguinal abscess, s/p I&D 3. Candida of groin  Plan: He  is able to return to normal activities. He should RTC in 3 weeks for a wound check.  He has been informed to remove his packing on Thursday and then no further packing needs to be completed.  He should keep it clean and dry.  He may continue using lotrimin for his candida; however, if this does not improve he should call and we can call him in some Nystatin powder.  He will take 7 days of Keflex for his abscess.

## 2013-01-24 ENCOUNTER — Other Ambulatory Visit (INDEPENDENT_AMBULATORY_CARE_PROVIDER_SITE_OTHER): Payer: Self-pay | Admitting: *Deleted

## 2013-01-24 ENCOUNTER — Encounter (INDEPENDENT_AMBULATORY_CARE_PROVIDER_SITE_OTHER): Payer: Self-pay | Admitting: *Deleted

## 2013-01-24 ENCOUNTER — Ambulatory Visit (INDEPENDENT_AMBULATORY_CARE_PROVIDER_SITE_OTHER): Payer: Self-pay | Admitting: General Surgery

## 2013-01-24 ENCOUNTER — Encounter (INDEPENDENT_AMBULATORY_CARE_PROVIDER_SITE_OTHER): Payer: Self-pay | Admitting: General Surgery

## 2013-01-24 VITALS — BP 132/86 | HR 102 | Temp 98.0°F | Resp 18 | Ht 69.0 in | Wt 201.0 lb

## 2013-01-24 DIAGNOSIS — L0291 Cutaneous abscess, unspecified: Secondary | ICD-10-CM

## 2013-01-24 DIAGNOSIS — L0390 Cellulitis, unspecified: Secondary | ICD-10-CM

## 2013-01-24 MED ORDER — SULFAMETHOXAZOLE-TRIMETHOPRIM 400-80 MG PO TABS: 1.0000 | ORAL_TABLET | Freq: Two times a day (BID) | ORAL | Status: AC

## 2013-01-24 MED ORDER — OXYCODONE-ACETAMINOPHEN 5-325 MG PO TABS: 1.0000 | ORAL_TABLET | ORAL | Status: DC | PRN

## 2013-01-24 NOTE — Progress Notes (Signed)
@  [email or web address]@  Subjective: Jordan Barnett is a 22 y.o. male who presents today status post lap appy by Dr. Dr. Gaynelle Adu. Pathology reveals acute appendicitis with serositis. The patient has an improving appetite, having normal bowel movements, has good pain control. He  is back to most normal activities. He c/o a lump in his right groin that started 3 days ago along with some yeast in this area as well. He is using over the counter lotrimin cream. No fevers or chills.  When he came back for check up on 01/03/13 he had a new right inguinal abscess that was I&Ded by the staff at that time. He returns now for follow up of I&D from 01/03/13 evaluation. Since his last visit the I&D site Right lower inguinal site has healed but he has a new site right groin femoral area. Objective:  BP 132/86  Pulse 102  Temp(Src) 98 F (36.7 C)  Resp 18  Ht 5\' 9"  (1.753 m)  Wt 91.173 kg (201 lb)  BMI 29.67 kg/m2   Prior to Admission medications   Medication Sig Start Date End Date Taking? Authorizing Provider  ibuprofen (ADVIL,MOTRIN) 600 MG tablet Take 1 tablet (600 mg total) by mouth every 6 (six) hours as needed (mild pain). 12/08/12  Yes Emina Riebock, NP  naproxen sodium (ANAPROX) 220 MG tablet Take 220 mg by mouth as needed (pain).   Yes Historical Provider, MD  oxyCODONE-acetaminophen (PERCOCET) 10-650 MG per tablet Take 1 tablet by mouth every 6 (six) hours as needed for pain. 01/03/13 01/03/14  Letha Cape, PA-C     Physical:  General appearance: alert, cooperative, no distress and having allot of pain right groin site. GI: soft, non-tender; bowel sounds normal; no masses,  no organomegaly.  Port sites from appendectomy are fine. Skin: He has a 5 x 7  cm site right groin that is raised and erythematous , warm to touch and extremely painful.  Assessment/Plan 1. Acute appendicitis, s/p lap appy  2. Right inguinal abscess, s/p I&D 01/03/13, now with new site right groin. 3. Candida of  groin (resolved)  PLan:  Site was examined by Dr. Michaell Cowing and myself.  It was his opinion pt should have and I&D of this site also.   Site cleaned, shaved, and prepped with betadine. I&D performed with 1% xylocaine and epi.  15 ml used to perform block.  1 cm site opened and site drained.  No remaining purulent material was left.  It was then packed with about 15 cm of iodoform gauze. We plan to let him pull the iodoform out over the next two days.  Send him home on 7 days of Septra.  He can clean and shower after the iodoform is out. Follow up in 1 week.   [name] 01/24/2013

## 2013-01-31 ENCOUNTER — Telehealth (INDEPENDENT_AMBULATORY_CARE_PROVIDER_SITE_OTHER): Payer: Self-pay | Admitting: *Deleted

## 2013-01-31 ENCOUNTER — Encounter (INDEPENDENT_AMBULATORY_CARE_PROVIDER_SITE_OTHER): Payer: Self-pay

## 2013-01-31 NOTE — Telephone Encounter (Signed)
Call pt to see why he missed his appt. Left message on mobile to call back.Jordan Barnett

## 2013-09-12 ENCOUNTER — Emergency Department (HOSPITAL_COMMUNITY): Payer: Self-pay

## 2013-09-12 ENCOUNTER — Encounter (HOSPITAL_COMMUNITY): Payer: Self-pay | Admitting: Emergency Medicine

## 2013-09-12 ENCOUNTER — Emergency Department (HOSPITAL_COMMUNITY)
Admission: EM | Admit: 2013-09-12 | Discharge: 2013-09-12 | Disposition: A | Discharge status: Home / Self Care | Payer: Self-pay | Attending: Emergency Medicine | Admitting: Emergency Medicine

## 2013-09-12 DIAGNOSIS — R1310 Dysphagia, unspecified: Secondary | ICD-10-CM | POA: Insufficient documentation

## 2013-09-12 DIAGNOSIS — F17200 Nicotine dependence, unspecified, uncomplicated: Secondary | ICD-10-CM | POA: Insufficient documentation

## 2013-09-12 LAB — BASIC METABOLIC PANEL
BUN: 14 mg/dL (ref 6–23)
CALCIUM: 10 mg/dL (ref 8.4–10.5)
CO2: 26 meq/L (ref 19–32)
CREATININE: 1.14 mg/dL (ref 0.50–1.35)
Chloride: 101 mEq/L (ref 96–112)
GFR calc Af Amer: >90 mL/min (ref >90)
GFR calc non Af Amer: >90 mL/min (ref >90)
Glucose, Bld: 75 mg/dL (ref 70–99)
Potassium: 4.3 mEq/L (ref 3.7–5.3)
Sodium: 142 mEq/L (ref 137–147)

## 2013-09-12 LAB — CBC
HCT: 44.7 % (ref 39.0–52.0)
Hemoglobin: 15 g/dL (ref 13.0–17.0)
MCH: 31.1 pg (ref 26.0–34.0)
MCHC: 33.6 g/dL (ref 30.0–36.0)
MCV: 92.5 fL (ref 78.0–100.0)
PLATELETS: 276 10*3/uL (ref 150–400)
RBC: 4.83 MIL/uL (ref 4.22–5.81)
RDW: 12.7 % (ref 11.5–15.5)
WBC: 15.2 10*3/uL — ABNORMAL HIGH (ref 4.0–10.5)

## 2013-09-12 LAB — I-STAT TROPONIN, ED: TROPONIN I, POC: 0 ng/mL (ref 0.00–0.08)

## 2013-09-12 MED ORDER — SUCRALFATE 1 GM/10ML PO SUSP
1.0000 g | Freq: Three times a day (TID) | ORAL | Status: DC
  Administered 2013-09-12: 1 g via ORAL
  Filled 2013-09-12 (×2): qty 10

## 2013-09-12 MED ORDER — FAMOTIDINE 20 MG PO TABS: 20.0000 mg | ORAL_TABLET | Freq: Two times a day (BID) | ORAL | Status: DC

## 2013-09-12 MED ORDER — GI COCKTAIL ~~LOC~~
30.0000 mL | Freq: Once | ORAL | Status: AC
  Administered 2013-09-12: 30 mL via ORAL
  Filled 2013-09-12: qty 30

## 2013-09-12 MED ORDER — SUCRALFATE 1 GM/10ML PO SUSP: 1.0000 g | Freq: Three times a day (TID) | ORAL | Status: DC

## 2013-09-12 MED ORDER — HYDROCODONE-ACETAMINOPHEN 5-325 MG PO TABS: 1.0000 | ORAL_TABLET | Freq: Four times a day (QID) | ORAL | Status: DC | PRN

## 2013-09-12 MED ORDER — FAMOTIDINE 40 MG/5ML PO SUSR
20.0000 mg | Freq: Once | ORAL | Status: AC
  Administered 2013-09-12: 20 mg via ORAL
  Filled 2013-09-12: qty 2.5

## 2013-09-12 NOTE — ED Notes (Signed)
Pt in xray

## 2013-09-12 NOTE — ED Provider Notes (Signed)
CSN: [id number]     Arrival date & time 09/12/13  1630 History   First MD Initiated Contact with Patient 09/12/13 1736     Chief Complaint  Patient presents with  . Chest Pain     (Consider location/radiation/quality/duration/timing/severity/associated sxs/prior Treatment) HPI Jordan Barnett is a 23 y.o. male who presents to ED with complaint of pain with swallowing. States woke up with symptoms yesterday morning. States pain is mainly in his chest. It is sharp. Only worsened when he swallows both liquids and solids. States he is able to swallow. He denies nausea or vomiting. No abdominal pain. No shortness of breath. No fever, chills, malaise. No other complaints.   History reviewed. No pertinent past medical history. Past Surgical History  Procedure Laterality Date  . Laparoscopic appendectomy N/A 12/07/2012    Procedure: APPENDECTOMY LAPAROSCOPIC;  Surgeon: Atilano InaEric M Wilson, MD;  [facility];  Service: General;  Laterality: N/A;   History reviewed. No pertinent family history. History  Substance Use Topics  . Smoking status: Current Every Day Smoker  . Smokeless tobacco: Not on file  . Alcohol Use: Yes    Review of Systems  Constitutional: Negative for fever and chills.  HENT: Positive for trouble swallowing. Negative for sore throat and voice change.   Respiratory: Positive for chest tightness. Negative for cough and shortness of breath.   Cardiovascular: Positive for chest pain. Negative for palpitations and leg swelling.  Gastrointestinal: Negative for nausea, vomiting, abdominal pain and abdominal distention.  Musculoskeletal: Negative for arthralgias.  Skin: Negative for rash.  Allergic/Immunologic: Negative for immunocompromised state.  Neurological: Negative for dizziness, weakness, light-headedness, numbness and headaches.      Allergies  Review of patient's allergies indicates no known allergies.  Home Medications   Prior to Admission medications   Not on  File   BP 122/81  Pulse 72  Temp(Src) 98.3 F (36.8 C) (Oral)  Resp 18  Ht 5\' 9"  (1.753 m)  Wt 209 lb (94.802 kg)  BMI 30.85 kg/m2  SpO2 95% Physical Exam  Nursing note and vitals reviewed. Constitutional: He appears well-developed and well-nourished. No distress.  HENT:  Head: Normocephalic and atraumatic.  Mouth/Throat: Oropharynx is clear and moist.  Eyes: Conjunctivae are normal.  Neck: Normal range of motion. Neck supple.  Cardiovascular: Normal rate, regular rhythm and normal heart sounds.   Pulmonary/Chest: Effort normal. No respiratory distress. He has no wheezes. He has no rales. He exhibits no tenderness.  Abdominal: Soft. Bowel sounds are normal. He exhibits no distension. There is no tenderness. There is no rebound.  Musculoskeletal: He exhibits no edema.  Neurological: He is alert.  Skin: Skin is warm and dry.    ED Course  Procedures (including critical care time) Labs Review Labs Reviewed  CBC - Abnormal; Notable for the following:    WBC 15.2 (*)    All other components within normal limits  BASIC METABOLIC PANEL  Rosezena SensorI-STAT TROPOININ, ED    Imaging Review Dg Chest 2 View  09/12/2013   CLINICAL DATA:  Chest pain.  EXAM: CHEST  2 VIEW  COMPARISON:  None.  FINDINGS: The heart size and mediastinal contours are within normal limits. Both lungs are clear. The visualized skeletal structures are unremarkable.  IMPRESSION: No active cardiopulmonary disease.   Electronically Signed   By: Myles RosenthalJohn  Stahl M.D.   On: 09/12/2013 18:08     Date: 09/12/2013  Rate: 82  Rhythm: normal sinus rhythm  QRS Axis: normal  Intervals: normal  ST/T Wave  abnormalities: normal  Conduction Disutrbances:none  Narrative Interpretation:   Old EKG Reviewed: none available    MDM   Final diagnoses:  Odynophagia    Pt with painful swallowing. Pain is from upper chest at suprasternal notch and to the epigastric area. IT is not reporducible to palpation. He denies shortness of breath.  Labs already obtained by triage RN, all negative other than elevated WBC. He is not vomiting. Able to swallow. Will get CXR.    8:41 PM CXR negative. Tired GI coctail with no relief. Tried carafate and pepcid PO with no relief. At this time, VS all normal, he is otherwise healthy in no distress. DDx includes gastritis, esophagitis, esophageal injury. Pt is able to swallow, doubt esophageal impaction. Will need close follow up with GI. Instructed to call tomorrow. Return if worsening symptoms.   Filed Vitals:   09/12/13 2000 09/12/13 2015 09/12/13 2030 09/12/13 2045  BP: 124/72 120/71 129/79 125/83  Pulse: 64 65 63 66  Temp:      TempSrc:      Resp: 19 19 21 24   Height:      Weight:      SpO2: 97% 97% 96% 97%      Shailee Foots A Dewanna Hurston, PA-C 09/12/13 2352

## 2013-09-12 NOTE — ED Notes (Signed)
He states "it felt like something was stuck in my throat all day yesterday. Then when i woke up and swallowed this morning i had a crushing pain from my throat to my chest." states the pain returns every time he swallows or burps.

## 2013-09-12 NOTE — Discharge Instructions (Signed)
Make sure to continue to intake fluids to prevent dehydration. Take carafate and pepcid as prescribed. Take norco for pain. Follow up with gastroenterologist as soon as able, call tomorrow. Return if worsening symptoms.   Esophagitis Esophagitis is inflammation of the esophagus. It can involve swelling, soreness, and pain in the esophagus. This condition can make it difficult and painful to swallow. CAUSES  Most causes of esophagitis are not serious. Many different factors can cause esophagitis, including:  Gastroesophageal reflux disease (GERD). This is when acid from your stomach flows up into the esophagus.  Recurrent vomiting.  An allergic-type reaction.  Certain medicines, especially those that come in large pills.  Ingestion of harmful chemicals, such as household cleaning products.  Heavy alcohol use.  An infection of the esophagus.  Radiation treatment for cancer.  Certain diseases such as sarcoidosis, Crohn's disease, and scleroderma. These diseases may cause recurrent esophagitis. SYMPTOMS   Trouble swallowing.  Painful swallowing.  Chest pain.  Difficulty breathing.  Nausea.  Vomiting.  Abdominal pain. DIAGNOSIS  Your caregiver will take your history and do a physical exam. Depending upon what your caregiver finds, certain tests may also be done, including:  Barium X-ray. You will drink a solution that coats the esophagus, and X-rays will be taken.  Endoscopy. A lighted tube is put down the esophagus so your caregiver can examine the area.  Allergy tests. These can sometimes be arranged through follow-up visits. TREATMENT  Treatment will depend on the cause of your esophagitis. In some cases, steroids or other medicines may be given to help relieve your symptoms or to treat the underlying cause of your condition. Medicines that may be recommended include:  Viscous lidocaine, to soothe the esophagus.  Antacids.  Acid reducers.  Proton pump  inhibitors.  Antiviral medicines for certain viral infections of the esophagus.  Antifungal medicines for certain fungal infections of the esophagus.  Antibiotic medicines, depending on the cause of the esophagitis. HOME CARE INSTRUCTIONS   Avoid foods and drinks that seem to make your symptoms worse.  Eat small, frequent meals instead of large meals.  Avoid eating for the 3 hours prior to your bedtime.  If you have trouble taking pills, use a pill splitter to decrease the size and likelihood of the pill getting stuck or injuring the esophagus on the way down. Drinking water after taking a pill also helps.  Stop smoking if you smoke.  Maintain a healthy weight.  Wear loose-fitting clothing. Do not wear anything tight around your waist that causes pressure on your stomach.  Raise the head of your bed 6 to 8 inches with wood blocks to help you sleep. Extra pillows will not help.  Only take over-the-counter or prescription medicines as directed by your caregiver. SEEK IMMEDIATE MEDICAL CARE IF:  You have severe chest pain that radiates into your arm, neck, or jaw.  You feel sweaty, dizzy, or lightheaded.  You have shortness of breath.  You vomit blood.  You have difficulty or pain with swallowing.  You have bloody or black, tarry stools.  You have a fever.  You have a burning sensation in the chest more than 3 times a week for more than 2 weeks.  You cannot swallow, drink, or eat.  You drool because you cannot swallow your saliva. MAKE SURE YOU:  Understand these instructions.  Will watch your condition.  Will get help right away if you are not doing well or get worse. Document Released: 04/09/2004 Document Revised: 05/25/2011 Document Reviewed:  10/31/2010 ExitCare Patient Information 2015 [employer] [place]. This information is not intended to replace advice given to you by your health care provider. Make sure you discuss any questions you have with your health care  provider.

## 2013-09-19 NOTE — ED Provider Notes (Signed)
Medical screening examination/treatment/procedure(s) were performed by non-physician practitioner and as supervising physician I was immediately available for consultation/collaboration.   EKG Interpretation   Date/Time:  Tuesday September 12 2013 16:34:37 EDT Ventricular Rate:  82 PR Interval:  148 QRS Duration: 74 QT Interval:  336 QTC Calculation: 392 R Axis:   47 Text Interpretation:  Normal sinus rhythm Normal ECG ED PHYSICIAN  INTERPRETATION AVAILABLE IN [facility] Confirmed by TEST, Record  ([id number]) on 09/14/2013 7:04:58 AM        Rolland PorterMark James, MD 09/19/13 [phone]

## 2014-01-09 ENCOUNTER — Encounter (HOSPITAL_COMMUNITY): Payer: Self-pay | Admitting: Emergency Medicine

## 2014-01-09 DIAGNOSIS — M5432 Sciatica, left side: Secondary | ICD-10-CM | POA: Insufficient documentation

## 2014-01-09 DIAGNOSIS — Z79899 Other long term (current) drug therapy: Secondary | ICD-10-CM | POA: Insufficient documentation

## 2014-01-09 DIAGNOSIS — Z720 Tobacco use: Secondary | ICD-10-CM | POA: Insufficient documentation

## 2014-01-09 NOTE — ED Notes (Signed)
Back pain that has been bothering me for the past couple of days. Pain going down my left leg.

## 2014-01-10 ENCOUNTER — Emergency Department (HOSPITAL_COMMUNITY)
Admission: EM | Admit: 2014-01-10 | Discharge: 2014-01-10 | Disposition: A | Discharge status: Home / Self Care | Payer: Self-pay | Attending: Emergency Medicine | Admitting: Emergency Medicine

## 2014-01-10 DIAGNOSIS — M5432 Sciatica, left side: Secondary | ICD-10-CM

## 2014-01-10 MED ORDER — METHYLPREDNISOLONE (PAK) 4 MG PO TABS: ORAL_TABLET | ORAL | Status: DC

## 2014-01-10 MED ORDER — OXYCODONE-ACETAMINOPHEN 5-325 MG PO TABS
2.0000 | ORAL_TABLET | Freq: Once | ORAL | Status: AC
  Administered 2014-01-10: 2 via ORAL
  Filled 2014-01-10: qty 2

## 2014-01-10 MED ORDER — OXYCODONE-ACETAMINOPHEN 5-325 MG PO TABS: 1.0000 | ORAL_TABLET | Freq: Four times a day (QID) | ORAL | Status: DC | PRN

## 2014-01-10 NOTE — Discharge Instructions (Signed)
Sciatica Sciatica is pain, weakness, numbness, or tingling along the path of the sciatic nerve. The nerve starts in the lower back and runs down the back of each leg. The nerve controls the muscles in the lower leg and in the back of the knee, while also providing sensation to the back of the thigh, lower leg, and the sole of your foot. Sciatica is a symptom of another medical condition. For instance, nerve damage or certain conditions, such as a herniated disk or bone spur on the spine, pinch or put pressure on the sciatic nerve. This causes the pain, weakness, or other sensations normally associated with sciatica. Generally, sciatica only affects one side of the body. CAUSES   Herniated or slipped disc.  Degenerative disk disease.  A pain disorder involving the narrow muscle in the buttocks (piriformis syndrome).  Pelvic injury or fracture.  Pregnancy.  Tumor (rare). SYMPTOMS  Symptoms can vary from mild to very severe. The symptoms usually travel from the low back to the buttocks and down the back of the leg. Symptoms can include:  Mild tingling or dull aches in the lower back, leg, or hip.  Numbness in the back of the calf or sole of the foot.  Burning sensations in the lower back, leg, or hip.  Sharp pains in the lower back, leg, or hip.  Leg weakness.  Severe back pain inhibiting movement. These symptoms may get worse with coughing, sneezing, laughing, or prolonged sitting or standing. Also, being overweight may worsen symptoms. DIAGNOSIS  Your caregiver will perform a physical exam to look for common symptoms of sciatica. He or she may ask you to do certain movements or activities that would trigger sciatic nerve pain. Other tests may be performed to find the cause of the sciatica. These may include:  Blood tests.  X-rays.  Imaging tests, such as an MRI or CT scan. TREATMENT  Treatment is directed at the cause of the sciatic pain. Sometimes, treatment is not necessary  and the pain and discomfort goes away on its own. If treatment is needed, your caregiver may suggest:  Over-the-counter medicines to relieve pain.  Prescription medicines, such as anti-inflammatory medicine, muscle relaxants, or narcotics.  Applying heat or ice to the painful area.  Steroid injections to lessen pain, irritation, and inflammation around the nerve.  Reducing activity during periods of pain.  Exercising and stretching to strengthen your abdomen and improve flexibility of your spine. Your caregiver may suggest losing weight if the extra weight makes the back pain worse.  Physical therapy.  Surgery to eliminate what is pressing or pinching the nerve, such as a bone spur or part of a herniated disk. HOME CARE INSTRUCTIONS   Only take over-the-counter or prescription medicines for pain or discomfort as directed by your caregiver.  Apply ice to the affected area for 20 minutes, 3-4 times a day for the first 48-72 hours. Then try heat in the same way.  Exercise, stretch, or perform your usual activities if these do not aggravate your pain.  Attend physical therapy sessions as directed by your caregiver.  Keep all follow-up appointments as directed by your caregiver.  Do not wear high heels or shoes that do not provide proper support.  Check your mattress to see if it is too soft. A firm mattress may lessen your pain and discomfort. SEEK IMMEDIATE MEDICAL CARE IF:   You lose control of your bowel or bladder (incontinence).  You have increasing weakness in the lower back, pelvis, buttocks,   or legs.  You have redness or swelling of your back.  You have a burning sensation when you urinate.  You have pain that gets worse when you lie down or awakens you at night.  Your pain is worse than you have experienced in the past.  Your pain is lasting longer than 4 weeks.  You are suddenly losing weight without reason. MAKE SURE YOU:  Understand these  instructions.  Will watch your condition.  Will get help right away if you are not doing well or get worse. Document Released: 02/24/2001 Document Revised: 09/01/2011 Document Reviewed: 07/12/2011 ExitCare Patient Information 2015 [employer]. This information is not intended to replace advice given to you by your health care provider. Make sure you discuss any questions you have with your health care provider.  

## 2014-01-10 NOTE — ED Provider Notes (Signed)
CSN: [id number]     Arrival date & time 01/09/14  2306 History   First MD Initiated Contact with Patient 01/10/14 0115     Chief Complaint  Patient presents with  . Back Pain     (Consider location/radiation/quality/duration/timing/severity/associated sxs/prior Treatment) HPI  This is a 23 year old who presents with left-sided back pain. Patient reports worsening of symptoms over the last 2-3 days. He states the pain is over his left back and positional. Worse with flexion of the left hip. Pain radiates down his left leg. He is tried ibuprofen and icy hot at home without relief. Currently his pain is 5 out of 10. He denies any bowel or bladder difficulty. He denies any weakness, numbness, tingling of the lower extremities. He reports that he has "deformity of the lower spine" and has recently seen a specialist and required physical therapy. Denies any fevers or IV drug use.  History reviewed. No pertinent past medical history. Past Surgical History  Procedure Laterality Date  . Laparoscopic appendectomy N/A 12/07/2012    Procedure: APPENDECTOMY LAPAROSCOPIC;  Surgeon: Atilano InaEric M Wilson, MD;  [facility];  Service: General;  Laterality: N/A;   No family history on file. History  Substance Use Topics  . Smoking status: Current Every Day Smoker  . Smokeless tobacco: Not on file  . Alcohol Use: Yes    Review of Systems  Constitutional: Negative.  Negative for fever.  Respiratory: Negative.   Cardiovascular: Negative.  Negative for chest pain.  Gastrointestinal: Negative.  Negative for abdominal pain.  Genitourinary: Negative.  Negative for dysuria and difficulty urinating.  Musculoskeletal: Positive for back pain. Negative for gait problem.  Neurological: Negative for weakness and numbness.  All other systems reviewed and are negative.     Allergies  Review of patient's allergies indicates no known allergies.  Home Medications   Prior to Admission medications   Medication  Sig Start Date End Date Taking? Authorizing Provider  famotidine (PEPCID) 20 MG tablet Take 1 tablet (20 mg total) by mouth 2 (two) times daily. 09/12/13   Tatyana A Kirichenko, PA-C  HYDROcodone-acetaminophen (NORCO) 5-325 MG per tablet Take 1 tablet by mouth every 6 (six) hours as needed. 09/12/13   Tatyana A Kirichenko, PA-C  methylPREDNIsolone (MEDROL DOSPACK) 4 MG tablet follow package directions 01/10/14   Shon Batonourtney F Horton, MD  oxyCODONE-acetaminophen (PERCOCET/ROXICET) 5-325 MG per tablet Take 1 tablet by mouth every 6 (six) hours as needed for severe pain. 01/10/14   Shon Batonourtney F Horton, MD  sucralfate (CARAFATE) 1 GM/10ML suspension Take 10 mLs (1 g total) by mouth 4 (four) times daily -  with meals and at bedtime. 09/12/13   Tatyana A Kirichenko, PA-C   BP 136/81  Pulse 67  Temp(Src) 98.2 F (36.8 C) (Oral)  Resp 16  Ht 5\' 9"  (1.753 m)  Wt 220 lb (99.791 kg)  BMI 32.47 kg/m2  SpO2 97% Physical Exam  Nursing note and vitals reviewed. Constitutional: He is oriented to person, place, and time. He appears well-developed and well-nourished. No distress.  HENT:  Head: Normocephalic and atraumatic.  Cardiovascular: Normal rate, regular rhythm and normal heart sounds.   Pulmonary/Chest: Effort normal and breath sounds normal. No respiratory distress.  Abdominal: Soft. There is no tenderness.  Musculoskeletal: He exhibits no edema.  No midline tenderness to palpation, positive left straight leg raise  Neurological: He is alert and oriented to person, place, and time.  5 out of 5 strength in all 4 extremities, 2+ patellar reflexes bilaterally, no  clonus noted  Skin: Skin is warm and dry.  Psychiatric: He has a normal mood and affect.    ED Course  Procedures (including critical care time) Labs Review Labs Reviewed - No data to display  Imaging Review No results found.   EKG Interpretation None      MDM   Final diagnoses:  Sciatica, left    Patient presents with back  pain. Story is consistent with sciatica. No evidence of acute cauda equina and neurologic exam is reassuring. Discussed with patient conservative management including steroids and pain management. He should follow-up with his primary physician and inquire regarding physical therapy. If patient's symptoms persist for greater than 6 weeks. At that time, he may need further imaging. Patient stated understanding.  After history, exam, and medical workup I feel the patient has been appropriately medically screened and is safe for discharge home. Pertinent diagnoses were discussed with the patient. Patient was given return precautions.     Shon Batonourtney F Horton, MD 01/10/14 [phone]

## 2014-01-19 ENCOUNTER — Encounter (HOSPITAL_COMMUNITY): Payer: Self-pay | Admitting: Emergency Medicine

## 2014-01-19 ENCOUNTER — Emergency Department (HOSPITAL_COMMUNITY)
Admission: EM | Admit: 2014-01-19 | Discharge: 2014-01-19 | Disposition: A | Discharge status: Home / Self Care | Payer: Self-pay | Attending: Emergency Medicine | Admitting: Emergency Medicine

## 2014-01-19 ENCOUNTER — Emergency Department (HOSPITAL_COMMUNITY): Payer: Self-pay

## 2014-01-19 DIAGNOSIS — M545 Low back pain, unspecified: Secondary | ICD-10-CM

## 2014-01-19 DIAGNOSIS — Z720 Tobacco use: Secondary | ICD-10-CM | POA: Insufficient documentation

## 2014-01-19 DIAGNOSIS — R200 Anesthesia of skin: Secondary | ICD-10-CM | POA: Insufficient documentation

## 2014-01-19 DIAGNOSIS — Z79899 Other long term (current) drug therapy: Secondary | ICD-10-CM | POA: Insufficient documentation

## 2014-01-19 DIAGNOSIS — Z7952 Long term (current) use of systemic steroids: Secondary | ICD-10-CM | POA: Insufficient documentation

## 2014-01-19 HISTORY — DX: Sciatica, unspecified side: M54.30

## 2014-01-19 MED ORDER — HYDROMORPHONE HCL 1 MG/ML IJ SOLN
1.0000 mg | Freq: Once | INTRAMUSCULAR | Status: AC
  Administered 2014-01-19: 1 mg via INTRAMUSCULAR
  Filled 2014-01-19: qty 1

## 2014-01-19 MED ORDER — FENTANYL CITRATE 0.05 MG/ML IJ SOLN
INTRAMUSCULAR | Status: AC
  Filled 2014-01-19: qty 2

## 2014-01-19 MED ORDER — FENTANYL CITRATE 0.05 MG/ML IJ SOLN
50.0000 ug | Freq: Once | INTRAMUSCULAR | Status: AC
  Administered 2014-01-19: 50 ug via NASAL

## 2014-01-19 MED ORDER — OXYCODONE-ACETAMINOPHEN 5-325 MG PO TABS: 1.0000 | ORAL_TABLET | Freq: Four times a day (QID) | ORAL | Status: DC | PRN

## 2014-01-19 MED ORDER — IBUPROFEN 800 MG PO TABS: 800.0000 mg | ORAL_TABLET | Freq: Three times a day (TID) | ORAL | Status: DC

## 2014-01-19 MED ORDER — ONDANSETRON 4 MG PO TBDP
8.0000 mg | ORAL_TABLET | Freq: Once | ORAL | Status: AC
  Administered 2014-01-19: 8 mg via ORAL
  Filled 2014-01-19: qty 2

## 2014-01-19 MED ORDER — KETOROLAC TROMETHAMINE 30 MG/ML IJ SOLN
30.0000 mg | Freq: Once | INTRAMUSCULAR | Status: AC
  Administered 2014-01-19: 30 mg via INTRAMUSCULAR
  Filled 2014-01-19: qty 1

## 2014-01-19 NOTE — ED Notes (Signed)
Patient transported to X-ray 

## 2014-01-19 NOTE — ED Notes (Signed)
Placed Heat packs on lower left back.

## 2014-01-19 NOTE — ED Notes (Signed)
Returned from xray

## 2014-01-19 NOTE — ED Provider Notes (Signed)
CSN: [id number]     Arrival date & time 01/19/14  0430 History   First MD Initiated Contact with Patient 01/19/14 [id number]     Chief Complaint  Patient presents with  . Sciatica     (Consider location/radiation/quality/duration/timing/severity/associated sxs/prior Treatment) HPI Comments: This is a 23 year old who presents with left-sided back pain for 2 weeks. The patient reports discomfort increased with body position and pressure on the left buttocks. He reports pain and numbness in the left lower extremity, currently just  pain. Patient reports he is been seen by a back specialist in the past, has not follow-up as instructed. Reports similar episodes of discomfort in the past. Patient reports taking 4 Tylenol prior to arrival without full resolution of symptoms. He reports minimal relief with fentanyl. He denies any bowel or bladder difficulty. Denies any fevers or IV drug use. No relief with it no given per nursing protocol.  The history is provided by the patient and medical records. No language interpreter was used.    History reviewed. No pertinent past medical history. Past Surgical History  Procedure Laterality Date  . Laparoscopic appendectomy N/A 12/07/2012    Procedure: APPENDECTOMY LAPAROSCOPIC;  Surgeon: Atilano InaEric M Wilson, MD;  [facility];  Service: General;  Laterality: N/A;   No family history on file. History  Substance Use Topics  . Smoking status: Current Every Day Smoker  . Smokeless tobacco: Not on file  . Alcohol Use: Yes    Review of Systems  Constitutional: Negative for fever and chills.  Gastrointestinal: Negative for abdominal pain.  Genitourinary: Negative for dysuria, urgency, decreased urine volume, enuresis and difficulty urinating.  Musculoskeletal: Positive for back pain.  Neurological: Positive for numbness. Negative for weakness.      Allergies  Review of patient's allergies indicates no known allergies.  Home Medications   Prior to Admission  medications   Medication Sig Start Date End Date Taking? Authorizing Provider  famotidine (PEPCID) 20 MG tablet Take 1 tablet (20 mg total) by mouth 2 (two) times daily. 09/12/13   Tatyana A Kirichenko, PA-C  HYDROcodone-acetaminophen (NORCO) 5-325 MG per tablet Take 1 tablet by mouth every 6 (six) hours as needed. 09/12/13   Tatyana A Kirichenko, PA-C  methylPREDNIsolone (MEDROL DOSPACK) 4 MG tablet follow package directions 01/10/14   Shon Batonourtney F Horton, MD  oxyCODONE-acetaminophen (PERCOCET/ROXICET) 5-325 MG per tablet Take 1 tablet by mouth every 6 (six) hours as needed for severe pain. 01/10/14   Shon Batonourtney F Horton, MD  sucralfate (CARAFATE) 1 GM/10ML suspension Take 10 mLs (1 g total) by mouth 4 (four) times daily -  with meals and at bedtime. 09/12/13   Tatyana A Kirichenko, PA-C   BP 155/86 mmHg  Pulse 83  Temp(Src) 97.4 F (36.3 C) (Oral)  Resp 20  Ht 5\' 9"  (1.753 m)  Wt 220 lb (99.791 kg)  BMI 32.47 kg/m2  SpO2 99% Physical Exam  Constitutional: He is oriented to person, place, and time. He appears well-developed and well-nourished. No distress.  HENT:  Head: Normocephalic and atraumatic.  Neck: Neck supple.  Pulmonary/Chest: Effort normal. No respiratory distress.  Abdominal: Soft.  Neurological: He is alert and oriented to person, place, and time. He exhibits normal muscle tone.  Reflex Scores:      Patellar reflexes are 1+ on the right side and 1+ on the left side. 5/5 strength bilaterally. Equal sensation bilaterally.  Skin: Skin is warm and dry.  Psychiatric: He has a normal mood and affect. His behavior is normal.  Nursing note and vitals reviewed.   ED Course  Procedures (including critical care time) Labs Review Labs Reviewed - No data to display  Imaging Review Dg Lumbar Spine Complete  01/19/2014   CLINICAL DATA:  Low back pain for 2 days, no injury, initial encounter  EXAM: LUMBAR SPINE - COMPLETE 4+ VIEW  COMPARISON:  None.  FINDINGS: Vertebral body height is  well maintained. Schmorl's nodes are noted superiorly at L2 and L3. No pars defects or spondylolisthesis is identified. No gross soft tissue abnormality is noted.  IMPRESSION: No acute abnormality noted.   Electronically Signed   By: Alcide CleverMark  Lukens M.D.   On: 01/19/2014 07:35     EKG Interpretation None      MDM   Final diagnoses:  Low back pain   Patient with known history of sciatica presents with low back pain, no red flags on exam. Plan to treat for pain and reevaluate. She is tearful throughout exam. Reevaluation patient denies symptom relief with medication. Dr. Rhunette CroftNanavati also evaluated the patient. XR negative for acute findings. Re-eval pt reports left lower extremity discomfort 5/10, still 10/10 low back pain. Pt is able to hold weight up with left lower extremity on the bed. Discussed XR results with the patient. Dr. Gardiner RhymeZavits also evaluated the patient, able to ambulate. And agrees follow up out-patient. Meds given in ED:  Medications  fentaNYL (SUBLIMAZE) injection 50 mcg (50 mcg Nasal Given 01/19/14 0449)  ketorolac (TORADOL) 30 MG/ML injection 30 mg (30 mg Intramuscular Given 01/19/14 0625)  HYDROmorphone (DILAUDID) injection 1 mg (1 mg Intramuscular Given 01/19/14 0625)  HYDROmorphone (DILAUDID) injection 1 mg (1 mg Intramuscular Given 01/19/14 0800)  HYDROmorphone (DILAUDID) injection 1 mg (1 mg Intramuscular Given 01/19/14 0812)  ondansetron (ZOFRAN-ODT) disintegrating tablet 8 mg (8 mg Oral Given 01/19/14 0905)    Discharge Medication List as of 01/19/2014  8:38 AM    START taking these medications   Details  ibuprofen (ADVIL,MOTRIN) 800 MG tablet Take 1 tablet (800 mg total) by mouth 3 (three) times daily., Starting 01/19/2014, Until Discontinued, Print    !! oxyCODONE-acetaminophen (PERCOCET) 5-325 MG per tablet Take 1 tablet by mouth every 6 (six) hours as needed., Starting 01/19/2014, Until Discontinued, Print     !! - Potential duplicate medications found. Please discuss  with provider.        Mellody DrownLauren Avant Printy, PA-C 01/19/14 [id number]  Derwood KaplanAnkit Nanavati, MD 01/20/14 1300

## 2014-01-19 NOTE — ED Notes (Signed)
Pt. reports progressing pain at left buttocks radiating to left leg onset last night , seen here last week prescribed with pain medication and oral steroids with no relief.

## 2014-01-19 NOTE — Discharge Instructions (Signed)
Call a back specialist for further evaluation of your low back pain and possible out-patient MRI. Call for a follow up appointment with a Family or Primary Care Provider.  Return if Symptoms worsen.   Take medication as prescribed.  Do not operate heavy machinery or drink alcohol while taking narcotic pain medication.

## 2014-12-31 ENCOUNTER — Ambulatory Visit: Payer: BLUE CROSS/BLUE SHIELD | Attending: Orthopedic Surgery | Admitting: Physical Therapy

## 2014-12-31 DIAGNOSIS — M545 Low back pain, unspecified: Secondary | ICD-10-CM

## 2014-12-31 NOTE — Therapy (Signed)
[facility] [address] [place], [place], [address] Phone: [phone]   Fax:  [phone]  Physical Therapy Evaluation  Patient Details  Name: Jordan LollingJustin Barnett MRN: [id number] Date of Birth: Oct 09, 1990 Referring Provider: Venita Lickahari Brooks MD.  Encounter Date: 12/31/2014      PT End of Session - 12/31/14 1324    Visit Number 1   Number of Visits 12   Date for PT Re-Evaluation 01/13/16   Authorization - Visit Number 102   Activity Tolerance Patient tolerated treatment well;Treatment limited secondary to medical complications (Comment)      Past Medical History  Diagnosis Date  . Sciatica     Past Surgical History  Procedure Laterality Date  . Laparoscopic appendectomy N/A 12/07/2012    Procedure: APPENDECTOMY LAPAROSCOPIC;  Surgeon: Atilano InaEric M Wilson, MD;  [facility];  Service: General;  Laterality: N/A;    There were no vitals filed for this visit.  Visit Diagnosis:  Midline low back pain without sciatica - Plan: PT plan of care cert/re-cert      Subjective Assessment - 12/31/14 1333    Currently in Pain? Yes   Pain Score 2    Pain Location Back   Pain Orientation Left   Pain Descriptors / Indicators Aching   Pain Type Chronic pain   Pain Frequency Constant   Aggravating Factors  Certain movements.   Pain Relieving Factors rest.            [facility] PT Assessment - 12/31/14 0001    Assessment   Medical Diagnosis Lumbar microdicectomy.   Referring Provider Venita Lickahari Brooks MD.   Onset Date/Surgical Date --  10/25/14 (surgery date).   Precautions   Precautions --  Lumbar discectomy protocol.   Restrictions   Weight Bearing Restrictions No   Balance Screen   Has the patient fallen in the past 6 months No   Has the patient had a decrease in activity level because of a fear of falling?  No   Is the patient reluctant to leave their home because of a fear of falling?  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   Palpation   Palpation comment Mild tenderness on either side of his lumbar incisional site.   Special Tests    Special Tests --  Normal bilateral LE DTR's.   Ambulation/Gait   Gait Comments Normal gait cycle.                   [facility] Adult PT Treatment/Exercise - 12/31/14 0001    Modalities   Modalities Electrical Stimulation;Moist Heat   Moist Heat Therapy   Moist Heat Location Lumbar Spine   Electrical Stimulation   Electrical Stimulation Location Bilateral low back.   Electrical Stimulation Action 80-150 HZ at 80-150 HZ x 20 minutes.   Electrical Stimulation Goals Pain                     PT Long Term Goals - 12/31/14 1341    PT LONG TERM GOAL #1   Title Independent with a HEP.   Time 4   Period Weeks   Status New   PT LONG TERM GOAL #2   Title Stand 30 minutes with pain  not > 3/10.   Time 4   Period Weeks   Status New   PT LONG TERM GOAL #3   Title Perform ADL's with pain not > 3/10.   Time 4   Period Weeks   Status New               Plan - 12/31/14 1330    Clinical Impression Statement The patient underwent a lumbar discectomy on 10/25/14.  He states he is much better as prior to surgery he had pain and weakness in his left LE.  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   Pt will benefit from skilled therapeutic intervention in order to improve on the following deficits Pain;Decreased activity tolerance   Rehab Potential Excellent   PT Frequency 3x / week   PT Duration 4 weeks   PT Treatment/Interventions Moist Heat;Electrical Stimulation;Ultrasound;Therapeutic activities;Therapeutic exercise;Manual techniques   PT Next Visit Plan Back stabilization exercise progess.  Please see lumbar  microdiscectomy protocol.  Modalites as needed.         Problem List Patient Active Problem List   Diagnosis Date Noted  . Acute appendicitis without mention of peritonitis 12/07/2012  . S/P laparoscopic appendectomy 12/07/2012    Jordan Barnett, Jordan Barnett 12/31/2014, 1:47 PM  [facility] [address] [place], [place], [address] Phone: [phone]   Fax:  [phone]  Name: Jordan Barnett MRN: [id number] Date of Birth: [birth year]

## 2015-01-02 ENCOUNTER — Ambulatory Visit: Payer: BLUE CROSS/BLUE SHIELD | Admitting: Physical Therapy

## 2015-01-02 DIAGNOSIS — M545 Low back pain, unspecified: Secondary | ICD-10-CM

## 2015-01-02 NOTE — Patient Instructions (Addendum)

## 2015-01-02 NOTE — Therapy (Signed)
[facility] [address] [place], [place], [address] Phone: [phone]   Fax:  [phone]  Physical Therapy Treatment  Patient Details  Name: Jordan Barnett MRN: [id number] Date of Birth: 1991-01-07 Referring Provider: Venita Lick MD.  Encounter Date: 01/02/2015      PT End of Session - 01/02/15 0856    Visit Number 2   Number of Visits 12   Date for PT Re-Evaluation 01/13/16   PT Start Time 0859   PT Stop Time 0946   PT Time Calculation (min) 47 min   Activity Tolerance Patient tolerated treatment well   Behavior During Therapy [facility] for tasks assessed/performed      Past Medical History  Diagnosis Date  . Sciatica     Past Surgical History  Procedure Laterality Date  . Laparoscopic appendectomy N/A 12/07/2012    Procedure: APPENDECTOMY LAPAROSCOPIC;  Surgeon: Atilano Ina, MD;  Location: [facility] OR;  Service: General;  Laterality: N/A;    There were no vitals filed for this visit.  Visit Diagnosis:  Midline low back pain without sciatica      Subjective Assessment - 01/02/15 0901    Subjective Patient is anxious to get back to work. Scheduled for 01/18/15 but he would like to return sooner if possible. His main complaint is difficulty bending forward to put on socks for example.    Currently in Pain? No/denies                         [facility] Adult PT Treatment/Exercise - 01/02/15 0001    Self-Care   Self-Care ADL's   ADL's Reviewed ADL modifications to prevent further injury   Exercises   Exercises Lumbar;Knee/Hip   Lumbar Exercises: Stretches   Active Hamstring Stretch 2 reps;30 seconds  bil; foot on 4 inch step   Single Knee to Chest Stretch 2 reps;30 seconds  Bil   Piriformis Stretch 2 reps;30 seconds  bil; also bent knee across body bil   Piriformis Stretch Limitations Rt hip only crossed leg   Lumbar Exercises: Standing   Functional Squats 10 reps  Good form without cueing   Lumbar  Exercises: Supine   Ab Set 5 reps;5 seconds   Clam 10 reps  bil   Heel Slides 10 reps  bil   Bent Knee Raise 10 reps  bil; then up/up/down/down   Other Supine Lumbar Exercises table top position with toe taps x 10 ea   Knee/Hip Exercises: Aerobic   Nustep L6 x 8 min                PT Education - 01/02/15 1234    Education provided Yes   Education Details HEP; ADL modifications   Person(s) Educated Patient   Methods Explanation;Demonstration;Handout   Comprehension Verbalized understanding;Returned demonstration             PT Long Term Goals - 12/31/14 1341    PT LONG TERM GOAL #1   Title Independent with a HEP.   Time 4   Period Weeks   Status New   PT LONG TERM GOAL #2   Title Stand 30 minutes with pain not > 3/10.   Time 4   Period Weeks   Status New   PT LONG TERM GOAL #3   Title Perform ADL's with pain not > 3/10.   Time 4   Period Weeks   Status New  Plan - 01/02/15 1235    Clinical Impression Statement Patient tolerated therex very well and demos good core strength and good awareness of body mechanics. He has marked tightness of bil gluteals, hip rotators, piriformis and hamstrings. He should be able to tolerate RTW without significant lifting. He reports having to lift up to 50# intermitently.   PT Next Visit Plan Work on lifting as patient to RTW soon and could lift up to 50# eventually. Back stabilization exercise progess.  Please see lumbar microdiscectomy protocol.  Modalites as needed.   PT Home Exercise Plan hip/knee stretches; table top with toe taps        Problem List Patient Active Problem List   Diagnosis Date Noted  . Acute appendicitis without mention of peritonitis 12/07/2012  . S/P laparoscopic appendectomy 12/07/2012   Solon PalmJulie Keniya Schlotterbeck PT I10/19/2016, 12:39 PM  [facility] [address] [place], [place], [address] Phone: [phone]   Fax:   [phone]  Name: Jordan Barnett MRN: [id number] Date of Birth: 1990/10/09

## 2015-01-09 ENCOUNTER — Ambulatory Visit: Payer: BLUE CROSS/BLUE SHIELD | Admitting: Physical Therapy

## 2015-01-09 ENCOUNTER — Encounter: Payer: Self-pay | Admitting: Physical Therapy

## 2015-01-09 DIAGNOSIS — M545 Low back pain, unspecified: Secondary | ICD-10-CM

## 2015-01-09 NOTE — Therapy (Signed)
[facility] [address] [place], [place], [address] Phone: [phone]   Fax:  [phone]  Physical Therapy Treatment  Patient Details  Name: Jordan LollingJustin Barnett MRN: [id number] Date of Birth: 03-Aug-1990 Referring Provider: Venita Lickahari Brooks MD.  Encounter Date: 01/09/2015      PT End of Session - 01/09/15 0904    Visit Number 3   Number of Visits 12   Date for PT Re-Evaluation 01/13/16   PT Start Time 0902   PT Stop Time 0945   PT Time Calculation (min) 43 min   Activity Tolerance Patient tolerated treatment well   Behavior During Therapy [facility] for tasks assessed/performed      Past Medical History  Diagnosis Date  . Sciatica     Past Surgical History  Procedure Laterality Date  . Laparoscopic appendectomy N/A 12/07/2012    Procedure: APPENDECTOMY LAPAROSCOPIC;  Surgeon: Atilano InaEric M Wilson, MD;  [facility];  Service: General;  Laterality: N/A;    There were no vitals filed for this visit.  Visit Diagnosis:  Midline low back pain without sciatica      Subjective Assessment - 01/09/15 0903    Subjective Reports that he went to work last night and where he had to squat a lot he is sore in his LEs today. Stats that he experienced some low back tightness. Reports increased pain with prolonged activity.   Limitations Other (comment)   Patient Stated Goals Return to work.   Currently in Pain? No/denies            [facility] - 01/09/15 0001    Assessment   Medical Diagnosis Lumbar microdicectomy.   Onset Date/Surgical Date 10/25/14                     [facility] Adult PT Treatment/Exercise - 01/09/15 0001    Lumbar Exercises: Stretches   Active Hamstring Stretch 30 seconds;Other (comment);2 reps  Bilaterally   Single Knee to Chest Stretch 3 reps;30 seconds;Other (comment)  Bilaterally   Lumbar Exercises: Aerobic   Stationary Bike L5 x12 min   Lumbar Exercises: Standing   Functional Squats 20 reps  to  mat table and with good form without cueing required   Lumbar Exercises: Supine   Clam 15 reps  with VCs for core activation   Heel Slides 20 reps  Bilaterally with VCs for core activation   Bent Knee Raise 20 reps;Other (comment)  with VCs for core activation   Bridge 10 reps   Lumbar Exercises: Quadruped   Single Arm Raise 10 reps;Right;Left   Straight Leg Raise 10 reps  BLE                     PT Long Term Goals - 01/09/15 0904    PT LONG TERM GOAL #1   Title Independent with a HEP.   Time 4   Period Weeks   Status Achieved   PT LONG TERM GOAL #2   Title Stand 30 minutes with pain not > 3/10.   Time 4   Period Weeks   Status Achieved   PT LONG TERM GOAL #3   Title Perform ADL's with pain not > 3/10.   Time 4   Period Weeks   Status Achieved               Plan - 01/09/15 0945    Clinical Impression Statement Patient tolerated today's treatment very well today. Completed all exercises well with  proper technique following minimal multimodal cueing. Required multimodal cueing during quadruped exercise with LE extension to prevent pelvic rotation and keep proper lumbar alignment. Has achieved all LT goals set at evaluation per patient report only has trouble with prolonged activity. Verbalized no back pain following today's treatment and less soreness. Educated to continue HEP given previously in PT.   Pt will benefit from skilled therapeutic intervention in order to improve on the following deficits Pain;Decreased activity tolerance   Rehab Potential Excellent   PT Frequency 3x / week   PT Duration 4 weeks   PT Treatment/Interventions Moist Heat;Electrical Stimulation;Ultrasound;Therapeutic activities;Therapeutic exercise;Manual techniques   PT Next Visit Plan Work on lifting as patient to RTW soon and could lift up to 50# eventually. Back stabilization exercise progess.  Please see lumbar microdiscectomy protocol.  Modalites as needed.   Consulted and  Agree with Plan of Care Patient        Problem List Patient Active Problem List   Diagnosis Date Noted  . Acute appendicitis without mention of peritonitis 12/07/2012  . S/P laparoscopic appendectomy 12/07/2012    Evelene Croon, PTA 01/09/2015, 11:11 AM  [facility] [address] [place], [place], [address] Phone: [phone]   Fax:  [phone]  Name: Jordan Barnett MRN: [id number] Date of Birth: 12-12-1990

## 2015-01-11 ENCOUNTER — Ambulatory Visit: Payer: BLUE CROSS/BLUE SHIELD | Admitting: Physical Therapy

## 2015-01-11 DIAGNOSIS — M545 Low back pain, unspecified: Secondary | ICD-10-CM

## 2015-01-11 NOTE — Therapy (Signed)
[facility] [address] [place], [place], [address] Phone: [phone]   Fax:  [phone]  Physical Therapy Treatment  Patient Details  Name: Jordan LollingJustin Wegner MRN: [id number] Date of Birth: 1990/07/06 Referring Provider: Venita Lickahari Brooks MD.  Encounter Date: 01/11/2015      PT End of Session - 01/11/15 0957    Visit Number (p) 4   Number of Visits (p) 12   Date for PT Re-Evaluation (p) 01/13/16   PT Start Time (p) 0900   PT Stop Time (p) 0947   PT Time Calculation (min) (p) 47 min   Activity Tolerance (p) Patient tolerated treatment well   Behavior During Therapy (p) WFL for tasks assessed/performed      Past Medical History  Diagnosis Date  . Sciatica     Past Surgical History  Procedure Laterality Date  . Laparoscopic appendectomy N/A 12/07/2012    Procedure: APPENDECTOMY LAPAROSCOPIC;  Surgeon: Atilano InaEric M Wilson, MD;  [facility];  Service: General;  Laterality: N/A;    There were no vitals filed for this visit.  Visit Diagnosis:  Midline low back pain without sciatica      Subjective Assessment - 01/11/15 1033    Subjective I went to work this week and both of my thighs were very sore.  It felt like it was in the bone.  Not too bad today.   Pain Score 4    Pain Location Back   Pain Orientation Left   Pain Descriptors / Indicators Aching   Pain Frequency Constant                         [facility] Adult PT Treatment/Exercise - 01/11/15 0001    Lumbar Exercises: Aerobic   Elliptical L2/L2 x 8 minutes f/b Rockerboard in parallel bars x 5 minutes for calf stretching.   Modalities   Modalities Ultrasound   Ultrasound   Ultrasound Location Prone over 2 pillows for comfort while patient received U/S at 1.50 W/CM2 x 8 minutes.   Manual Therapy   Manual therapy comments STW/M to patient's left lumbar paraspinal musculature which were tender to palpation x 20 minutes.                     PT  Long Term Goals - 01/09/15 0904    PT LONG TERM GOAL #1   Title Independent with a HEP.   Time 4   Period Weeks   Status Achieved   PT LONG TERM GOAL #2   Title Stand 30 minutes with pain not > 3/10.   Time 4   Period Weeks   Status Achieved   PT LONG TERM GOAL #3   Title Perform ADL's with pain not > 3/10.   Time 4   Period Weeks   Status Achieved               Problem List Patient Active Problem List   Diagnosis Date Noted  . Acute appendicitis without mention of peritonitis 12/07/2012  . S/P laparoscopic appendectomy 12/07/2012    Hazim Treadway, [place] MPT 01/11/2015, 10:50 AM  [facility] [address] [place], [place], [address] Phone: [phone]   Fax:  [phone]  Name: Jordan LollingJustin Swanton MRN: [id number] Date of Birth: 1990/07/06

## 2015-01-15 ENCOUNTER — Encounter: Payer: BLUE CROSS/BLUE SHIELD | Admitting: *Deleted

## 2015-01-16 ENCOUNTER — Encounter: Payer: Self-pay | Admitting: *Deleted

## 2015-01-16 ENCOUNTER — Ambulatory Visit: Payer: BLUE CROSS/BLUE SHIELD | Attending: Orthopedic Surgery | Admitting: *Deleted

## 2015-01-16 DIAGNOSIS — M545 Low back pain, unspecified: Secondary | ICD-10-CM

## 2015-01-16 NOTE — Therapy (Signed)
[facility] [address] [place], [place], [address] Phone: [phone]   Fax:  [phone]  Physical Therapy Treatment  Patient Details  Name: Jordan LollingJustin Barnett MRN: [id number] Date of Birth: 03-Oct-1990 Referring Provider: Venita Lickahari Brooks MD.  Encounter Date: 01/16/2015      PT End of Session - 01/16/15 1239    Visit Number 4   Number of Visits 12   PT Start Time 0945   PT Stop Time 1035   PT Time Calculation (min) 50 min      Past Medical History  Diagnosis Date  . Sciatica     Past Surgical History  Procedure Laterality Date  . Laparoscopic appendectomy N/A 12/07/2012    Procedure: APPENDECTOMY LAPAROSCOPIC;  Surgeon: Atilano InaEric M Wilson, MD;  [facility];  Service: General;  Laterality: N/A;    There were no vitals filed for this visit.  Visit Diagnosis:  Midline low back pain without sciatica      Subjective Assessment - 01/16/15 0955    Subjective Bent over on Halloween night and felt a pain in my back almost got sick to my stomach. I went and laid down for a while . 4/10 pain. Doing a little better. No pain in my leg   Patient Stated Goals Return to work.   Currently in Pain? Yes   Pain Score 4    Pain Location Back   Pain Orientation Left   Pain Descriptors / Indicators Aching   Pain Type Chronic pain;Surgical pain   Pain Frequency Constant   Aggravating Factors  certain movements   Pain Relieving Factors rest                         [facility] Adult PT Treatment/Exercise - 01/16/15 0001    Exercises   Exercises Lumbar;Knee/Hip   Lumbar Exercises: Aerobic   Elliptical R10/L5 x 10 minutes with focus on posture   Lumbar Exercises: Supine   Ab Set 10 reps;5 seconds  Drawin   Bent Knee Raise 20 reps;Other (comment)  with VCs for core activation   Bridge 5 seconds;20 reps   Modalities   Modalities Ultrasound   Ultrasound   Ultrasound Location LT LB paras prone   Ultrasound Parameters 1.5 w/cm2 x  10 mins at 1mhz   Ultrasound Goals Pain            Reviewed and demonstrated proper technique and movement patterns for transitional movements                PT Long Term Goals - 01/09/15 0904    PT LONG TERM GOAL #1   Title Independent with a HEP.   Time 4   Period Weeks   Status Achieved   PT LONG TERM GOAL #2   Title Stand 30 minutes with pain not > 3/10.   Time 4   Period Weeks   Status Achieved   PT LONG TERM GOAL #3   Title Perform ADL's with pain not > 3/10.   Time 4   Period Weeks   Status Achieved               Plan - 01/16/15 1236    Clinical Impression Statement Pt did fairly well with Rx today, but was unable to progress exs due to pt straining his back 2 days ago by bending over and felt a pull. He feels that it is starting to calm back down, but is still a little guarded.  Transitional movementsstill cause the most pain at this time   Pt will benefit from skilled therapeutic intervention in order to improve on the following deficits Pain;Decreased activity tolerance   Rehab Potential Excellent   PT Frequency 3x / week   PT Duration 4 weeks   PT Treatment/Interventions Moist Heat;Electrical Stimulation;Ultrasound;Therapeutic activities;Therapeutic exercise;Manual techniques   PT Next Visit Plan Work on lifting as patient to RTW soon and could lift up to 50# eventually. Back stabilization exercise progess.  Please see lumbar microdiscectomy protocol.  Modalites as needed.   PT Home Exercise Plan hip/knee stretches; table top with toe taps   Consulted and Agree with Plan of Care Patient        Problem List Patient Active Problem List   Diagnosis Date Noted  . Acute appendicitis without mention of peritonitis 12/07/2012  . S/P laparoscopic appendectomy 12/07/2012    RAMSEUR,CHRIS, PTA 01/16/2015, 12:42 PM  [facility] [address] [place], [place], [address] Phone: [phone]   Fax:   [phone]  Name: Jordan Barnett MRN: [id number] Date of Birth: 10-28-1990

## 2015-01-17 ENCOUNTER — Ambulatory Visit: Payer: BLUE CROSS/BLUE SHIELD | Admitting: *Deleted

## 2015-01-17 ENCOUNTER — Encounter: Payer: Self-pay | Admitting: *Deleted

## 2015-01-17 DIAGNOSIS — M545 Low back pain, unspecified: Secondary | ICD-10-CM

## 2015-01-17 NOTE — Therapy (Signed)
[facility] [address] [place], [place], [address] Phone: [phone]   Fax:  [phone]  Physical Therapy Treatment  Patient Details  Name: Jordan Barnett MRN: [id number] Date of Birth: [birth year] Referring Provider: Venita Lick MD.  Encounter Date: 01/17/2015      PT End of Session - 01/17/15 1205    Visit Number 5   Number of Visits 12   Date for PT Re-Evaluation 01/13/16   PT Start Time 1030   PT Stop Time 1120   PT Time Calculation (min) 50 min      Past Medical History  Diagnosis Date  . Sciatica     Past Surgical History  Procedure Laterality Date  . Laparoscopic appendectomy N/A 12/07/2012    Procedure: APPENDECTOMY LAPAROSCOPIC;  Surgeon: Atilano Ina, MD;  [facility];  Service: General;  Laterality: N/A;    There were no vitals filed for this visit.  Visit Diagnosis:  Midline low back pain without sciatica      Subjective Assessment - 01/17/15 1044    Subjective Doing better today with low pain levels 1-2/10. 12 weeks today   Limitations Other (comment)   Patient Stated Goals Return to work.   Currently in Pain? Yes   Pain Score 4    Pain Location Back   Pain Orientation Left   Pain Descriptors / Indicators Aching   Pain Type Chronic pain;Surgical pain   Pain Frequency Constant   Aggravating Factors  certain movements   Pain Relieving Factors rest                         [facility] Adult PT Treatment/Exercise - 01/17/15 0001    Exercises   Exercises Lumbar;Knee/Hip   Lumbar Exercises: Aerobic   Elliptical R10/L5 x 10 minutes with focus on posture   Lumbar Exercises: Supine   Ab Set 10 reps;5 seconds  Drawin   Bent Knee Raise 20 reps;Other (comment)  with VCs for core activation   Bridge 5 seconds;20 reps   Straight Leg Raise 10 reps  BIL x10   Lumbar Exercises: Quadruped   Single Arm Raise 20 reps;3 seconds   Straight Leg Raise 10 reps  BLE   Modalities   Modalities  Electrical Stimulation;Ultrasound   Electrical Stimulation   Electrical Stimulation Location LT LB Paras Premod x15 mins  80-150hz    Electrical Stimulation Goals Pain                     PT Long Term Goals - 01/09/15 0904    PT LONG TERM GOAL #1   Title Independent with a HEP.   Time 4   Period Weeks   Status Achieved   PT LONG TERM GOAL #2   Title Stand 30 minutes with pain not > 3/10.   Time 4   Period Weeks   Status Achieved   PT LONG TERM GOAL #3   Title Perform ADL's with pain not > 3/10.   Time 4   Period Weeks   Status Achieved               Plan - 01/16/15 1236    Clinical Impression Statement Pt did fairly well with Rx today, but was unable to progress exs due to pt straining his back 2 days ago by bending over and felt a pull. He feels that it is starting to calm back down, but is still a little guarded. Transitional movementsstill cause  the most pain at this time   Pt will benefit from skilled therapeutic intervention in order to improve on the following deficits Pain;Decreased activity tolerance   Rehab Potential Excellent   PT Frequency 3x / week   PT Duration 4 weeks   PT Treatment/Interventions Moist Heat;Electrical Stimulation;Ultrasound;Therapeutic activities;Therapeutic exercise;Manual techniques   PT Next Visit Plan Work on lifting as patient to RTW soon and could lift up to 50# eventually. Back stabilization exercise progess.  Please see lumbar microdiscectomy protocol.  Modalites as needed.   PT Home Exercise Plan hip/knee stretches; table top with toe taps   Consulted and Agree with Plan of Care Patient        Problem List Patient Active Problem List   Diagnosis Date Noted  . Acute appendicitis without mention of peritonitis 12/07/2012  . S/P laparoscopic appendectomy 12/07/2012    Mabel Roll,CHRIS,  PTA 01/17/2015, 12:23 PM  [facility] [address] [place], [place],  [address] Phone: [phone]   Fax:  [phone]  Name: Jordan Barnett MRN: [id number] Date of Birth: 1990/04/02

## 2015-01-22 ENCOUNTER — Encounter: Payer: BLUE CROSS/BLUE SHIELD | Admitting: *Deleted

## 2015-01-23 ENCOUNTER — Encounter: Payer: Self-pay | Admitting: *Deleted

## 2015-01-23 ENCOUNTER — Ambulatory Visit: Payer: BLUE CROSS/BLUE SHIELD | Admitting: *Deleted

## 2015-01-23 DIAGNOSIS — M545 Low back pain, unspecified: Secondary | ICD-10-CM

## 2015-01-23 NOTE — Patient Instructions (Signed)
Isometric Abdominal   Lying on back with knees bent, tighten stomach by pulling navel down. Hold ____ seconds. Repeat __5__ times per set. Do __5__ sets per session. Do _3-5___ sessions per day.  http://orth.exer.us/1086   Copyright  VHI. All rights reserved.  Bent Leg Lift (Hook-Lying)   Tighten stomach and slowly raise right leg _6___ inches from floor. Keep trunk rigid. Hold _2-3___ seconds. Repeat _10___ times per set. Do _3___ sets per session. Do _2-3___ sessions per day.  http://orth.exer.us/1090   Copyright  VHI. All rights reserved.  Bridging   Slowly raise buttocks from floor, keeping stomach tight. Repeat __10__ times per set. Do _3___ sets per session. Do __2-3__ sessions per day.  http://orth.exer.us/1096   Copyright  VHI. All rights reserved.  Bridging   Slowly raise buttocks from floor, keeping stomach tight.       MARCHING 3 X10 Repeat ____ times per set. Do ____ sets per session. Do ____ sessions per day.  http://orth.exer.us/1096   Copyright  VHI. All rights reserved.  Straight Leg Raise (Prone)   Abdomen and head supported, keep left knee locked and raise leg at hip. Avoid arching low back. Repeat _10___ times per set. Do _3___ sets per session. Do _2-3___ sessions per day.  http://orth.exer.us/1112   Copyright  VHI. All rights reserved.  Opposite Arm / Leg Lift (Prone)   Abdomen and head supported, left knee locked, raise leg and opposite arm ____ inches from floor. Repeat _10___ times per set. Do 3____ sets per session. Do __2-3__ sessions per day.  http://orth.exer.us/1114   Copyright  VHI. All rights reserved.  Combination (Hook-Lying)  Dead BUG                                                                                                                                   Also do diagonal knee and elbow Tighten stomach and slowly raise left leg and lower opposite arm over head. Keep trunk rigid. Repeat _10___ times per set. Do  _3___ sets per session. Do _2-3___ sessions per day.  http://orth.exer.us/1092   Copyright  VHI. All rights reserved.  Upper / Lower Extremity Extension (All-Fours)   Tighten stomach and raise right leg and opposite arm. Keep trunk rigid. Repeat __10__ times per set. Do __3__ sets per session. Do _2-3___ sessions per day.  http://orth.exer.us/1118   Copyright  VHI. All rights reserved.

## 2015-01-23 NOTE — Therapy (Addendum)
[facility] [place], [place], [address] Phone: [phone]   Fax:  [phone]  Physical Therapy Treatment  Patient Details  Name: Jordan Barnett MRN: [id number] Date of Birth: 1990-12-07 No Data Recorded  Encounter Date: 01/23/2015    Past Medical History:  Diagnosis Date  . Sciatica   . Thyroid disease     Past Surgical History:  Procedure Laterality Date  . LAPAROSCOPIC APPENDECTOMY N/A 12/07/2012   Procedure: APPENDECTOMY LAPAROSCOPIC;  Surgeon: Gayland Curry, MD;  Location: [place];  Service: General;  Laterality: N/A;    There were no vitals filed for this visit.  Visit Diagnosis:  Midline low back pain without sciatica                                    PT Long Term Goals - 01/09/15 0904      PT LONG TERM GOAL #1   Title Independent with a HEP.   Time 4   Period Weeks   Status Achieved     PT LONG TERM GOAL #2   Title Stand 30 minutes with pain not > 3/10.   Time 4   Period Weeks   Status Achieved     PT LONG TERM GOAL #3   Title Perform ADL's with pain not > 3/10.   Time 4   Period Weeks   Status Achieved               Problem List Patient Active Problem List   Diagnosis Date Noted  . Left chronic serous otitis media 12/24/2015  . Eustachian tube dysfunction, left 12/04/2015  . Allergic rhinitis 12/04/2015  . Acute appendicitis without mention of peritonitis 12/07/2012  . S/P laparoscopic appendectomy 12/07/2012    APPLEGATE, [place], PTA 02/10/2016, 5:47 PM  [facility] [address] [place], [place], [address] Phone: [phone]   Fax:  [phone]  Name: Jordan Barnett MRN: [id number] Date of Birth: 04-17-1990 PHYSICAL THERAPY DISCHARGE SUMMARY  Visits from Start of Care: 7.  Current functional level related to goals / functional outcomes: Please see above.   Remaining deficits: All goals  met.   Education / Equipment: HEP. Plan: Patient agrees to discharge.  Patient goals were met. Patient is being discharged due to meeting the stated rehab goals.  ?????         Mali Applegate MPT

## 2015-01-25 ENCOUNTER — Encounter: Payer: BLUE CROSS/BLUE SHIELD | Admitting: Physical Therapy

## 2015-12-04 ENCOUNTER — Ambulatory Visit (INDEPENDENT_AMBULATORY_CARE_PROVIDER_SITE_OTHER): Payer: BLUE CROSS/BLUE SHIELD | Admitting: Physician Assistant

## 2015-12-04 ENCOUNTER — Encounter: Payer: Self-pay | Admitting: Physician Assistant

## 2015-12-04 VITALS — BP 116/79 | HR 63 | Temp 97.1°F | Ht 69.0 in | Wt 216.8 lb

## 2015-12-04 DIAGNOSIS — J309 Allergic rhinitis, unspecified: Secondary | ICD-10-CM

## 2015-12-04 DIAGNOSIS — H6982 Other specified disorders of Eustachian tube, left ear: Secondary | ICD-10-CM | POA: Insufficient documentation

## 2015-12-04 DIAGNOSIS — E039 Hypothyroidism, unspecified: Secondary | ICD-10-CM

## 2015-12-04 MED ORDER — PREDNISONE 10 MG (21) PO TBPK: ORAL_TABLET | ORAL | 0 refills | Status: DC

## 2015-12-04 MED ORDER — TRIAMCINOLONE ACETONIDE 55 MCG/ACT NA AERO: 1.0000 | INHALATION_SPRAY | Freq: Two times a day (BID) | NASAL | 12 refills | Status: DC

## 2015-12-04 MED ORDER — LORATADINE 10 MG PO TABS: 10.0000 mg | ORAL_TABLET | Freq: Every day | ORAL | 11 refills | Status: DC

## 2015-12-04 MED ORDER — LEVOTHYROXINE SODIUM 100 MCG PO TABS: 100.0000 ug | ORAL_TABLET | Freq: Every day | ORAL | 2 refills | Status: DC

## 2015-12-04 NOTE — Progress Notes (Signed)
BP 116/79   Pulse 63   Temp 97.1 F (36.2 C) (Oral)   Ht 5\' 9"  (1.753 m)   Wt 216 lb 12.8 oz (98.3 kg)   BMI 32.02 kg/m    Subjective:    Patient ID: Jordan LollingJustin Chiquito, male    DOB: July 05, 1990, 25 y.o.   MRN: [id number]  Jordan Barnett is a 25 y.o. male presenting on 12/04/2015 for Ears popping (for 2 months and seems to get worse when he is at work-) and Medication Refill (refill on synthroid )  HPI this is a new patient to me today. He has had 2 weeks of left ear pain and pressure. He states he cannot relieve the pressure whatsoever. He has had very milder episodes like this in the past. Also his hearing screening at work was decreased on the side. He has significant allergies this time a year and states that he sneezes and has significant rhinorrhea throughout the day. He has tried nasal steroids in the past but used the technique of 2 sprays to each nostril at the same time and felt that it did not help. He has not taken any oral antihistamines. He also needs a refill on his thyroid medication. He takes levothyroxine 100 g. He is due for recheck within the next 3 months. An order will be built for him to be able to come on the date that he needs to come. In the meantime we will send a refill to his pharmacy.   Relevant past medical, surgical, family and social history reviewed and updated as indicated. Interim medical history since our last visit reviewed. Allergies and medications reviewed and updated.   Data reviewed from any sources in EPIC.  Review of Systems  Constitutional: Negative.  Negative for appetite change and fatigue.  HENT: Positive for ear pain, hearing loss, postnasal drip, rhinorrhea, sinus pressure and sneezing. Negative for ear discharge, nosebleeds, sore throat and tinnitus.   Eyes: Negative for pain and visual disturbance.  Respiratory: Negative.  Negative for cough, chest tightness, shortness of breath and wheezing.   Cardiovascular: Negative.  Negative for chest  pain, palpitations and leg swelling.  Gastrointestinal: Negative.  Negative for abdominal pain, diarrhea, nausea and vomiting.  Genitourinary: Negative.   Skin: Negative.  Negative for color change and rash.  Neurological: Negative.  Negative for weakness, numbness and headaches.  Psychiatric/Behavioral: Negative.     Per HPI unless specifically indicated above  Social History   Social History  . Marital status: Single    Spouse name: N/A  . Number of children: N/A  . Years of education: N/A   Occupational History  . Not on file.   Social History Main Topics  . Smoking status: Former Games developermoker  . Smokeless tobacco: Former NeurosurgeonUser  . Alcohol use No  . Drug use: No  . Sexual activity: Yes   Other Topics Concern  . Not on file   Social History Narrative  . No narrative on file    Past Surgical History:  Procedure Laterality Date  . LAPAROSCOPIC APPENDECTOMY N/A 12/07/2012   Procedure: APPENDECTOMY LAPAROSCOPIC;  Surgeon: Atilano InaEric M Wilson, MD;  [facility];  Service: General;  Laterality: N/A;    Family History  Problem Relation Age of Onset  . Hypertension Mother   . Diabetes Father       Medication List       Accurate as of 12/04/15 10:02 AM. Always use your most recent med list.  levothyroxine 100 MCG tablet Commonly known as:  SYNTHROID, LEVOTHROID Take 1 tablet (100 mcg total) by mouth daily.   loratadine 10 MG tablet Commonly known as:  CLARITIN Take 1 tablet (10 mg total) by mouth daily.   predniSONE 10 MG (21) Tbpk tablet Commonly known as:  STERAPRED UNI-PAK 21 TAB As directed x 6 days   triamcinolone 55 MCG/ACT Aero nasal inhaler Commonly known as:  NASACORT Place 1 spray into the nose 2 (two) times daily.          Objective:    BP 116/79   Pulse 63   Temp 97.1 F (36.2 C) (Oral)   Ht 5\' 9"  (1.753 m)   Wt 216 lb 12.8 oz (98.3 kg)   BMI 32.02 kg/m   No Known Allergies Wt Readings from Last 3 Encounters:  12/04/15 216 lb  12.8 oz (98.3 kg)  01/19/14 220 lb (99.8 kg)  01/09/14 220 lb (99.8 kg)    Physical Exam  Constitutional: He appears well-developed and well-nourished. No distress.  HENT:  Head: Normocephalic and atraumatic.  Right Ear: Hearing and external ear normal. No drainage or tenderness. No mastoid tenderness. Tympanic membrane is not injected. No decreased hearing is noted.  Left Ear: External ear and ear canal normal. No mastoid tenderness. Tympanic membrane is injected and retracted. Tympanic membrane mobility is abnormal. A middle ear effusion is present. Decreased hearing is noted.  Eyes: Conjunctivae and EOM are normal. Pupils are equal, round, and reactive to light.  Cardiovascular: Normal rate, regular rhythm and normal heart sounds.   Pulmonary/Chest: Effort normal and breath sounds normal. No respiratory distress.  Skin: Skin is warm and dry.  Psychiatric: He has a normal mood and affect. His behavior is normal.  Nursing note and vitals reviewed.   Results for orders placed or performed during the hospital encounter of 09/12/13  CBC  Result Value Ref Range   WBC 15.2 (H) 4.0 - 10.5 K/uL   RBC 4.83 4.22 - 5.81 MIL/uL   Hemoglobin 15.0 13.0 - 17.0 g/dL   HCT 16.1 09.6 - 04.5 %   MCV 92.5 78.0 - 100.0 fL   MCH 31.1 26.0 - 34.0 pg   MCHC 33.6 30.0 - 36.0 g/dL   RDW 40.9 81.1 - 91.4 %   Platelets 276 150 - 400 K/uL  Basic metabolic panel  Result Value Ref Range   Sodium 142 137 - 147 mEq/L   Potassium 4.3 3.7 - 5.3 mEq/L   Chloride 101 96 - 112 mEq/L   CO2 26 19 - 32 mEq/L   Glucose, Bld 75 70 - 99 mg/dL   BUN 14 6 - 23 mg/dL   Creatinine, Ser 7.82 0.50 - 1.35 mg/dL   Calcium 95.6 8.4 - 21.3 mg/dL   GFR calc non Af Amer >90 >90 mL/min   GFR calc Af Amer >90 >90 mL/min  I-stat troponin, ED (not at [facility])  Result Value Ref Range   Troponin i, poc 0.00 0.00 - 0.08 ng/mL   Comment 3              Assessment & Plan:   1. Eustachian tube dysfunction, left - triamcinolone  (NASACORT) 55 MCG/ACT AERO nasal inhaler; Place 1 spray into the nose 2 (two) times daily.  Dispense: 1 Inhaler; Refill: 12 - predniSONE (STERAPRED UNI-PAK 21 TAB) 10 MG (21) TBPK tablet; As directed x 6 days  Dispense: 21 tablet; Refill: 0  2. Allergic rhinitis, unspecified allergic rhinitis type - loratadine (CLARITIN)  10 MG tablet; Take 1 tablet (10 mg total) by mouth daily.  Dispense: 30 tablet; Refill: 11  3. Hypothyroidism, unspecified hypothyroidism type - levothyroxine (SYNTHROID, LEVOTHROID) 100 MCG tablet; Take 1 tablet (100 mcg total) by mouth daily.  Dispense: 30 tablet; Refill: 2 - TSH; Standing   Continue all other maintenance medications as listed above. Educational handout given for Eustachian tube dysfunction.  Follow up plan: Return in about 6 months (around 06/02/2016) for labs soon.  Remus Loffler PA-C [facility] [address]  [place], [place] [address] [phone]   12/04/2015, 10:02 AM

## 2015-12-04 NOTE — Patient Instructions (Signed)

## 2015-12-23 ENCOUNTER — Encounter: Payer: Self-pay | Admitting: Physician Assistant

## 2015-12-23 ENCOUNTER — Ambulatory Visit (INDEPENDENT_AMBULATORY_CARE_PROVIDER_SITE_OTHER): Payer: BLUE CROSS/BLUE SHIELD | Admitting: Physician Assistant

## 2015-12-23 VITALS — BP 111/78 | HR 69 | Temp 98.2°F | Ht 69.0 in | Wt 218.8 lb

## 2015-12-23 DIAGNOSIS — H6522 Chronic serous otitis media, left ear: Secondary | ICD-10-CM

## 2015-12-23 DIAGNOSIS — H6982 Other specified disorders of Eustachian tube, left ear: Secondary | ICD-10-CM

## 2015-12-23 DIAGNOSIS — J0100 Acute maxillary sinusitis, unspecified: Secondary | ICD-10-CM

## 2015-12-23 MED ORDER — AMOXICILLIN 500 MG PO CAPS: 1000.0000 mg | ORAL_CAPSULE | Freq: Two times a day (BID) | ORAL | 0 refills | Status: DC

## 2015-12-23 MED ORDER — PREDNISONE 10 MG (21) PO TBPK: ORAL_TABLET | ORAL | 0 refills | Status: DC

## 2015-12-23 NOTE — Patient Instructions (Signed)
Serous Otitis Media Serous otitis media is fluid in the middle ear space. This space contains the bones for hearing and air. Air in the middle ear space helps to transmit sound.  The air gets there through the eustachian tube. This tube goes from the back of the nose (nasopharynx) to the middle ear space. It keeps the pressure in the middle ear the same as the outside world. It also helps to drain fluid from the middle ear space. CAUSES  Serous otitis media occurs when the eustachian tube gets blocked. Blockage can come from:  Ear infections.  Colds and other upper respiratory infections.  Allergies.  Irritants such as cigarette smoke.  Sudden changes in air pressure (such as descending in an airplane).  Enlarged adenoids.  A mass in the nasopharynx. During colds and upper respiratory infections, the middle ear space can become temporarily filled with fluid. This can happen after an ear infection also. Once the infection clears, the fluid will generally drain out of the ear through the eustachian tube. If it does not, then serous otitis media occurs. SIGNS AND SYMPTOMS   Hearing loss.  A feeling of fullness in the ear, without pain.  Young children may not show any symptoms but may show slight behavioral changes, such as agitation, ear pulling, or crying. DIAGNOSIS  Serous otitis media is diagnosed by an ear exam. Tests may be done to check on the movement of the eardrum. Hearing exams may also be done. TREATMENT  The fluid most often goes away without treatment. If allergy is the cause, allergy treatment may be helpful. Fluid that persists for several months may require minor surgery. A small tube is placed in the eardrum to:  Drain the fluid.  Restore the air in the middle ear space. In certain situations, antibiotic medicines are used to avoid surgery. Surgery may be done to remove enlarged adenoids (if this is the cause). HOME CARE INSTRUCTIONS   Keep children away from  tobacco smoke.  Keep all follow-up visits as directed by your health care provider. SEEK MEDICAL CARE IF:   Your hearing is not better in 3 months.  Your hearing is worse.  You have ear pain.  You have drainage from the ear.  You have dizziness.  You have serous otitis media only in one ear or have any bleeding from your nose (epistaxis).  You notice a lump on your neck. MAKE SURE YOU:  Understand these instructions.   Will watch your condition.   Will get help right away if you are not doing well or get worse.    This information is not intended to replace advice given to you by your health care provider. Make sure you discuss any questions you have with your health care provider.   Document Released: 05/23/2003 Document Revised: 03/23/2014 Document Reviewed: 09/27/2012 Elsevier Interactive Patient Education 2016 Elsevier Inc.  

## 2015-12-24 DIAGNOSIS — H6522 Chronic serous otitis media, left ear: Secondary | ICD-10-CM | POA: Insufficient documentation

## 2015-12-24 NOTE — Progress Notes (Signed)
BP 111/78   Pulse 69   Temp 98.2 F (36.8 C) (Oral)   Ht 5\' 9"  (1.753 m)   Wt 218 lb 12.8 oz (99.2 kg)   BMI 32.31 kg/m    Subjective:    Patient ID: Jordan Barnett, male    DOB: January 26, 1991, 25 y.o.   MRN: [id number]  HPI: Jordan Barnett is a 25 y.o. male presenting on 12/23/2015 for Ear pain/popping (Bilateral )  Patient's been taking this medication for about one month for the serous otitis. He has been using nasal spray. He states in the past few days it has suddenly gotten worse. The left ear was particular painful. He is having popping in that ear. He also has had decreased hearing in the ear. He is expressing some facial pain related to a sinus infection also. He denies any fever and chills at this time. We have discussed that he may need to have ENT evaluation to help alleviate his this eustachian tube dysfunction. I have told him that some adults may even have to have a tube placed to alleviate the pressure in the middle ear.   Relevant past medical, surgical, family and social history reviewed and updated as indicated. Interim medical history since our last visit reviewed. Allergies and medications reviewed and updated. DATA REVIEWED: CHART IN EPIC  Social History   Social History  . Marital status: Single    Spouse name: N/A  . Number of children: N/A  . Years of education: N/A   Occupational History  . Not on file.   Social History Main Topics  . Smoking status: Former Games developermoker  . Smokeless tobacco: Former NeurosurgeonUser  . Alcohol use No  . Drug use: No  . Sexual activity: Yes   Other Topics Concern  . Not on file   Social History Narrative  . No narrative on file    Past Surgical History:  Procedure Laterality Date  . LAPAROSCOPIC APPENDECTOMY N/A 12/07/2012   Procedure: APPENDECTOMY LAPAROSCOPIC;  Surgeon: Atilano InaEric M Wilson, MD;  [facility];  Service: General;  Laterality: N/A;    Family History  Problem Relation Age of Onset  . Hypertension Mother   .  Diabetes Father     Review of Systems  Constitutional: Negative.  Negative for appetite change and fatigue.  HENT: Positive for congestion, ear pain, hearing loss, postnasal drip, sinus pressure and sore throat. Negative for ear discharge.   Eyes: Negative for pain and visual disturbance.  Respiratory: Negative.  Negative for cough, chest tightness, shortness of breath and wheezing.   Cardiovascular: Negative.  Negative for chest pain, palpitations and leg swelling.  Gastrointestinal: Negative.  Negative for abdominal pain, diarrhea, nausea and vomiting.  Genitourinary: Negative.   Skin: Negative.  Negative for color change and rash.  Neurological: Negative.  Negative for dizziness, weakness, numbness and headaches.  Psychiatric/Behavioral: Negative.       Medication List       Accurate as of 12/23/15 11:59 PM. Always use your most recent med list.          amoxicillin 500 MG capsule Commonly known as:  AMOXIL Take 2 capsules (1,000 mg total) by mouth 2 (two) times daily.   levothyroxine 100 MCG tablet Commonly known as:  SYNTHROID, LEVOTHROID Take 1 tablet (100 mcg total) by mouth daily.   loratadine 10 MG tablet Commonly known as:  CLARITIN Take 1 tablet (10 mg total) by mouth daily.   predniSONE 10 MG (21) Tbpk tablet Commonly known as:  STERAPRED UNI-PAK 21 TAB As directed x 6 days   triamcinolone 55 MCG/ACT Aero nasal inhaler Commonly known as:  NASACORT Place 1 spray into the nose 2 (two) times daily.          Objective:    BP 111/78   Pulse 69   Temp 98.2 F (36.8 C) (Oral)   Ht 5\' 9"  (1.753 m)   Wt 218 lb 12.8 oz (99.2 kg)   BMI 32.31 kg/m   No Known Allergies  Wt Readings from Last 3 Encounters:  12/23/15 218 lb 12.8 oz (99.2 kg)  12/04/15 216 lb 12.8 oz (98.3 kg)  01/19/14 220 lb (99.8 kg)    Physical Exam  Constitutional: He is oriented to person, place, and time. He appears well-developed and well-nourished.  HENT:  Head: Normocephalic  and atraumatic.  Right Ear: Tympanic membrane and external ear normal. No middle ear effusion.  Left Ear: External ear and ear canal normal. Tympanic membrane is erythematous and retracted. A middle ear effusion is present. Decreased hearing is noted.  Ears:  Nose: Mucosal edema and rhinorrhea present. Right sinus exhibits no maxillary sinus tenderness. Left sinus exhibits maxillary sinus tenderness.  Mouth/Throat: Uvula is midline. Posterior oropharyngeal erythema present.  Eyes: Conjunctivae and EOM are normal. Pupils are equal, round, and reactive to light. Right eye exhibits no discharge. Left eye exhibits no discharge.  Neck: Normal range of motion.  Cardiovascular: Normal rate, regular rhythm and normal heart sounds.   Pulmonary/Chest: Effort normal and breath sounds normal. No respiratory distress. He has no wheezes.  Abdominal: Soft.  Lymphadenopathy:    He has no cervical adenopathy.  Neurological: He is alert and oriented to person, place, and time.  Skin: Skin is warm and dry.  Psychiatric: He has a normal mood and affect.        Assessment & Plan:   1. Eustachian tube dysfunction, left - predniSONE (STERAPRED UNI-PAK 21 TAB) 10 MG (21) TBPK tablet; As directed x 6 days  Dispense: 21 tablet; Refill: 0 - Ambulatory referral to ENT  2. Acute maxillary sinusitis, recurrence not specified - amoxicillin (AMOXIL) 500 MG capsule; Take 2 capsules (1,000 mg total) by mouth 2 (two) times daily.  Dispense: 40 capsule; Refill: 0 - Ambulatory referral to ENT  3. Left chronic serous otitis media - Ambulatory referral to ENT   Continue all other maintenance medications as listed above.  Follow up plan: Return if symptoms worsen or fail to improve.   Orders Placed This Encounter  Procedures  . Ambulatory referral to ENT    Educational handout given for serous otitis.  Remus Loffler PA-C [facility] [address]  [place], [place]  [address] [phone]   12/24/2015, 10:50 PM

## 2016-02-24 ENCOUNTER — Ambulatory Visit (INDEPENDENT_AMBULATORY_CARE_PROVIDER_SITE_OTHER): Payer: BLUE CROSS/BLUE SHIELD | Admitting: Physician Assistant

## 2016-02-24 ENCOUNTER — Encounter: Payer: Self-pay | Admitting: Physician Assistant

## 2016-02-24 VITALS — BP 119/82 | HR 81 | Temp 98.1°F | Ht 69.0 in | Wt 218.4 lb

## 2016-02-24 DIAGNOSIS — E782 Mixed hyperlipidemia: Secondary | ICD-10-CM | POA: Diagnosis not present

## 2016-02-24 DIAGNOSIS — E039 Hypothyroidism, unspecified: Secondary | ICD-10-CM

## 2016-02-24 NOTE — Progress Notes (Signed)
BP 119/82   Pulse 81   Temp 98.1 F (36.7 C) (Oral)   Ht 5\' 9"  (1.753 m)   Wt 218 lb 6.4 oz (99.1 kg)   BMI 32.25 kg/m    Subjective:    Patient ID: Jordan Barnett, male    DOB: 18-Apr-1990, 25 y.o.   MRN: [id number]  HPI: Jordan Barnett is a 25 y.o. male presenting on 02/24/2016 for discuss labs (Patient is here today to discuss labs that he had done at work) The patient's laboratory note are positive for elevated triglycerides and mildly elevated total cholesterol. His sternal struts were 444. He does report he had not been taking his Synthroid on a regular basis. I've encouraged him to work on reducing carbs in his diet and eliminating simple sugars such as soda. He will also reduce the amount of bread he is eating. We will plan to recheck this in 2 months so that we can recheck the TSH and lipid panel at the same time. All of his other labs were within normal limits.   Past Medical History:  Diagnosis Date  . Sciatica   . Thyroid disease    Relevant past medical, surgical, family and social history reviewed and updated as indicated. Interim medical history since our last visit reviewed. Allergies and medications reviewed and updated. DATA REVIEWED: CHART IN EPIC  Social History   Social History  . Marital status: Single    Spouse name: N/A  . Number of children: N/A  . Years of education: N/A   Occupational History  . Not on file.   Social History Main Topics  . Smoking status: Former Games developermoker  . Smokeless tobacco: Former NeurosurgeonUser  . Alcohol use No  . Drug use: No  . Sexual activity: Yes   Other Topics Concern  . Not on file   Social History Narrative  . No narrative on file    Past Surgical History:  Procedure Laterality Date  . LAPAROSCOPIC APPENDECTOMY N/A 12/07/2012   Procedure: APPENDECTOMY LAPAROSCOPIC;  Surgeon: Atilano InaEric M Wilson, MD;  [facility];  Service: General;  Laterality: N/A;    Family History  Problem Relation Age of Onset  . Hypertension  Mother   . Diabetes Father     Review of Systems  Constitutional: Negative.  Negative for appetite change and fatigue.  Eyes: Negative for pain and visual disturbance.  Respiratory: Negative.  Negative for cough, chest tightness, shortness of breath and wheezing.   Cardiovascular: Negative.  Negative for chest pain, palpitations and leg swelling.  Gastrointestinal: Negative.  Negative for abdominal pain, diarrhea, nausea and vomiting.  Genitourinary: Negative.   Skin: Negative.  Negative for color change and rash.  Neurological: Negative.  Negative for weakness, numbness and headaches.  Psychiatric/Behavioral: Negative.       Medication List       Accurate as of 02/24/16  1:45 PM. Always use your most recent med list.          levothyroxine 100 MCG tablet Commonly known as:  SYNTHROID, LEVOTHROID Take 1 tablet (100 mcg total) by mouth daily.   loratadine 10 MG tablet Commonly known as:  CLARITIN Take 1 tablet (10 mg total) by mouth daily.   triamcinolone 55 MCG/ACT Aero nasal inhaler Commonly known as:  NASACORT Place 1 spray into the nose 2 (two) times daily.          Objective:    BP 119/82   Pulse 81   Temp 98.1 F (36.7 C) (Oral)  Ht 5\' 9"  (1.753 m)   Wt 218 lb 6.4 oz (99.1 kg)   BMI 32.25 kg/m   No Known Allergies  Wt Readings from Last 3 Encounters:  02/24/16 218 lb 6.4 oz (99.1 kg)  12/23/15 218 lb 12.8 oz (99.2 kg)  12/04/15 216 lb 12.8 oz (98.3 kg)    Physical Exam  Constitutional: He appears well-developed and well-nourished. No distress.  HENT:  Head: Normocephalic and atraumatic.  Eyes: Conjunctivae and EOM are normal. Pupils are equal, round, and reactive to light.  Cardiovascular: Normal rate, regular rhythm and normal heart sounds.   Pulmonary/Chest: Effort normal and breath sounds normal. No respiratory distress.  Skin: Skin is warm and dry.  Psychiatric: He has a normal mood and affect. His behavior is normal.  Nursing note and  vitals reviewed.       Assessment & Plan:   1. Elevated cholesterol with high triglycerides - Lipid panel; Future - TSH; Future  2. Hypothyroidism, unspecified type - TSH; Future   Continue all other maintenance medications as listed above.  Follow up plan: Return if symptoms worsen or fail to improve.  Orders Placed This Encounter  Procedures  . Lipid panel  . TSH    Educational handout given for hypertriglyceridemia  Remus LofflerAngel S. Inaki Vantine PA-C [facility] [address]  [place], [place] [id number] [phone]   02/24/2016, 1:45 PM

## 2016-02-24 NOTE — Patient Instructions (Signed)

## 2016-03-04 ENCOUNTER — Ambulatory Visit: Payer: BLUE CROSS/BLUE SHIELD | Admitting: Physician Assistant

## 2016-04-23 ENCOUNTER — Encounter: Payer: Self-pay | Admitting: Pediatrics

## 2016-04-23 ENCOUNTER — Ambulatory Visit (INDEPENDENT_AMBULATORY_CARE_PROVIDER_SITE_OTHER): Payer: BLUE CROSS/BLUE SHIELD | Admitting: Pediatrics

## 2016-04-23 ENCOUNTER — Encounter (INDEPENDENT_AMBULATORY_CARE_PROVIDER_SITE_OTHER): Payer: Self-pay

## 2016-04-23 VITALS — BP 120/84 | HR 95 | Temp 98.8°F | Resp 20 | Ht 69.0 in | Wt 220.0 lb

## 2016-04-23 DIAGNOSIS — J029 Acute pharyngitis, unspecified: Secondary | ICD-10-CM | POA: Diagnosis not present

## 2016-04-23 DIAGNOSIS — J069 Acute upper respiratory infection, unspecified: Secondary | ICD-10-CM | POA: Diagnosis not present

## 2016-04-23 LAB — RAPID STREP SCREEN (MED CTR MEBANE ONLY): STREP GP A AG, IA W/REFLEX: NEGATIVE

## 2016-04-23 LAB — CULTURE, GROUP A STREP

## 2016-04-23 MED ORDER — LORATADINE 10 MG PO TABS: 10.0000 mg | ORAL_TABLET | Freq: Every day | ORAL | 11 refills | Status: DC

## 2016-04-23 NOTE — Patient Instructions (Signed)
Netipot with distilled water 2-3 times a day to clear out sinuses Or Normal saline nasal spray Flonase steroid nasal spray Antihistamine daily such as cetirizine Ibuprofen 600mg three times a day Lots of fluids  

## 2016-04-23 NOTE — Progress Notes (Signed)
  Subjective:   Patient ID: Jordan Barnett, male    DOB: 1990-07-20, 26 y.o.   MRN: [id number] CC: Sore Throat; Cough; and Shortness of Breath  HPI: Jordan LollingJustin Mclarty is a 26 y.o. male presenting for Sore Throat; Cough; and Shortness of Breath  Started with sore throat yesterday morning Hoarse this morning Coughing some this morning Ate once yesterday, decreased appetite No fevers, aches or chills   Relevant past medical, surgical, family and social history reviewed. Allergies and medications reviewed and updated. History  Smoking Status  . Former Smoker  Smokeless Tobacco  . Former NeurosurgeonUser   ROS: Per HPI   Objective:    BP 120/84   Pulse 95   Temp 98.8 F (37.1 C) (Oral)   Resp 20   Ht 5\' 9"  (1.753 m)   Wt 220 lb (99.8 kg)   SpO2 97%   BMI 32.49 kg/m   Wt Readings from Last 3 Encounters:  04/23/16 220 lb (99.8 kg)  02/24/16 218 lb 6.4 oz (99.1 kg)  12/23/15 218 lb 12.8 oz (99.2 kg)    Gen: NAD, alert, cooperative with exam, NCAT EYES: EOMI, no conjunctival injection, or no icterus ENT:  TMs pearly gray b/l, OP with mild erythema LYMPH: no cervical LAD CV: NRRR, normal S1/S2, no murmur, distal pulses 2+ b/l Resp: CTABL, no wheezes, normal WOB Abd: +BS, soft, NTND. no guarding or organomegaly Ext: No edema, warm Neuro: Alert and oriented MSK: normal muscle bulk  Assessment & Plan:  Jill AlexandersJustin was seen today for sore throat, cough and shortness of breath.  Diagnoses and all orders for this visit:  Sore throat Strep negative Will f/u culture -     Rapid st[facility] screen (not [facility]) -     Culture, Group A Strep  Acute URI Discussed symptomatic care -     loratadine (CLARITIN) 10 MG tablet; Take 1 tablet (10 mg total) by mouth daily.  Other orders -     Culture, Group A Strep   Follow up plan: Return if symptoms worsen or fail to improve. Rex Krasarol Vincent, MD [facility]

## 2016-04-26 LAB — CULTURE, GROUP A STREP: STREP A CULTURE: NEGATIVE

## 2016-04-27 ENCOUNTER — Ambulatory Visit: Payer: BLUE CROSS/BLUE SHIELD | Admitting: Physician Assistant

## 2016-05-07 ENCOUNTER — Ambulatory Visit: Payer: BLUE CROSS/BLUE SHIELD | Admitting: Physician Assistant

## 2016-05-11 ENCOUNTER — Encounter: Payer: Self-pay | Admitting: Physician Assistant

## 2016-05-22 ENCOUNTER — Ambulatory Visit (INDEPENDENT_AMBULATORY_CARE_PROVIDER_SITE_OTHER): Payer: BLUE CROSS/BLUE SHIELD | Admitting: Physician Assistant

## 2016-05-22 ENCOUNTER — Encounter: Payer: Self-pay | Admitting: Physician Assistant

## 2016-05-22 VITALS — BP 120/71 | HR 80 | Temp 96.8°F | Ht 69.0 in | Wt 229.6 lb

## 2016-05-22 DIAGNOSIS — R5383 Other fatigue: Secondary | ICD-10-CM

## 2016-05-22 DIAGNOSIS — G4710 Hypersomnia, unspecified: Secondary | ICD-10-CM | POA: Diagnosis not present

## 2016-05-22 DIAGNOSIS — E039 Hypothyroidism, unspecified: Secondary | ICD-10-CM | POA: Diagnosis not present

## 2016-05-22 DIAGNOSIS — E7800 Pure hypercholesterolemia, unspecified: Secondary | ICD-10-CM | POA: Diagnosis not present

## 2016-05-22 DIAGNOSIS — R0683 Snoring: Secondary | ICD-10-CM | POA: Diagnosis not present

## 2016-05-22 DIAGNOSIS — E785 Hyperlipidemia, unspecified: Secondary | ICD-10-CM | POA: Insufficient documentation

## 2016-05-22 NOTE — Patient Instructions (Signed)

## 2016-05-22 NOTE — Progress Notes (Signed)
BP 120/71   Pulse 80   Temp (!) 96.8 F (36 C) (Oral)   Ht '5\' 9"'  (1.753 m)   Wt 229 lb 9.6 oz (104.1 kg)   BMI 33.91 kg/m    Subjective:    Patient ID: Jordan Barnett, male    DOB: 10-02-1990, 26 y.o.   MRN: [id number]  HPI: Jordan Barnett is a 26 y.o. male presenting on 05/22/2016 for Follow-up and Hypothyroidism  This patient comes in for periodic recheck on medications and conditions including hypothyroid, elevated cholesterol. Patient reports that he has reduced his soda intake. We are hoping this will affect his triglycerides. He also notes that he is still having significant fatigue. He can sleep 9 hours and wakes up tired. He does have daytime somnolence. He does not admit to drowsy driving. He states that if he had this he would have his wife drive. Wife states that he does snore significantly but does not report any specific apnea witnessed.  All medications are reviewed today. There are no reports of any problems with the medications. All of the medical conditions are reviewed and updated.  Lab work is reviewed and will be ordered as medically necessary. There are no new problems reported with today's visit.   Relevant past medical, surgical, family and social history reviewed and updated as indicated. Allergies and medications reviewed and updated.  Past Medical History:  Diagnosis Date  . Sciatica   . Thyroid disease     Past Surgical History:  Procedure Laterality Date  . LAPAROSCOPIC APPENDECTOMY N/A 12/07/2012   Procedure: APPENDECTOMY LAPAROSCOPIC;  Surgeon: Gayland Curry, MD;  Location: [facility];  Service: General;  Laterality: N/A;    Review of Systems  Constitutional: Positive for fatigue. Negative for appetite change and unexpected weight change.  HENT: Negative.   Eyes: Negative.  Negative for pain and visual disturbance.  Respiratory: Negative.  Negative for cough, chest tightness, shortness of breath and wheezing.   Cardiovascular: Negative.  Negative for  chest pain, palpitations and leg swelling.  Gastrointestinal: Negative.  Negative for abdominal pain, diarrhea, nausea and vomiting.  Endocrine: Negative.   Genitourinary: Negative.   Musculoskeletal: Negative.   Skin: Negative.  Negative for color change and rash.  Neurological: Negative.  Negative for weakness, numbness and headaches.  Psychiatric/Behavioral: Negative.     Allergies as of 05/22/2016   No Known Allergies     Medication List       Accurate as of 05/22/16 12:00 PM. Always use your most recent med list.          levothyroxine 100 MCG tablet Commonly known as:  SYNTHROID, LEVOTHROID Take 1 tablet (100 mcg total) by mouth daily.   loratadine 10 MG tablet Commonly known as:  CLARITIN Take 1 tablet (10 mg total) by mouth daily.   triamcinolone 55 MCG/ACT Aero nasal inhaler Commonly known as:  NASACORT Place 1 spray into the nose 2 (two) times daily.          Objective:    BP 120/71   Pulse 80   Temp (!) 96.8 F (36 C) (Oral)   Ht '5\' 9"'  (1.753 m)   Wt 229 lb 9.6 oz (104.1 kg)   BMI 33.91 kg/m   No Known Allergies  Physical Exam  Constitutional: He appears well-developed and well-nourished.  HENT:  Head: Normocephalic and atraumatic.  Eyes: Conjunctivae and EOM are normal. Pupils are equal, round, and reactive to light.  Neck: Normal range of motion. Neck supple.  Cardiovascular: Normal rate, regular rhythm and normal heart sounds.   Pulmonary/Chest: Effort normal and breath sounds normal.  Abdominal: Soft. Bowel sounds are normal.  Musculoskeletal: Normal range of motion.  Skin: Skin is warm and dry.  Nursing note and vitals reviewed.       Assessment & Plan:   1. Snoring - Ambulatory referral to Neurology  2. Hypersomnolence - Ambulatory referral to Neurology  3. Fatigue, unspecified type - CBC with Differential/Platelet - CMP14+EGFR - Lipid panel - Thyroid Panel With TSH - Ambulatory referral to Neurology  4. Hypothyroidism,  unspecified type - CBC with Differential/Platelet - CMP14+EGFR - Lipid panel - Thyroid Panel With TSH  5. Elevated cholesterol - CMP14+EGFR - Lipid panel   Continue all other maintenance medications as listed above.  Follow up plan: Return in about 6 months (around 11/22/2016) for recheck.  Educational handout given for sleep apnea  Terald Sleeper PA-C [facility] [address]  [place], [place] [address] [phone]   05/22/2016, 12:00 PM

## 2016-05-23 LAB — CBC WITH DIFFERENTIAL/PLATELET
BASOS: 0 %
Basophils Absolute: 0 10*3/uL (ref 0.0–0.2)
EOS (ABSOLUTE): 0.1 10*3/uL (ref 0.0–0.4)
EOS: 2 %
HEMOGLOBIN: 14.7 g/dL (ref 13.0–17.7)
Hematocrit: 42.9 % (ref 37.5–51.0)
Immature Grans (Abs): 0 10*3/uL (ref 0.0–0.1)
Immature Granulocytes: 0 %
LYMPHS: 34 %
Lymphocytes Absolute: 2.1 10*3/uL (ref 0.7–3.1)
MCH: 30.4 pg (ref 26.6–33.0)
MCHC: 34.3 g/dL (ref 31.5–35.7)
MCV: 89 fL (ref 79–97)
Monocytes Absolute: 0.6 10*3/uL (ref 0.1–0.9)
Monocytes: 10 %
NEUTROS ABS: 3.4 10*3/uL (ref 1.4–7.0)
NEUTROS PCT: 54 %
PLATELETS: 295 10*3/uL (ref 150–379)
RBC: 4.83 x10E6/uL (ref 4.14–5.80)
RDW: 14.2 % (ref 12.3–15.4)
WBC: 6.2 10*3/uL (ref 3.4–10.8)

## 2016-05-23 LAB — CMP14+EGFR
ALK PHOS: 66 IU/L (ref 39–117)
ALT: 26 IU/L (ref 0–44)
AST: 17 IU/L (ref 0–40)
Albumin/Globulin Ratio: 1.6 (ref 1.2–2.2)
Albumin: 4.2 g/dL (ref 3.5–5.5)
BUN/Creatinine Ratio: 14 (ref 9–20)
BUN: 12 mg/dL (ref 6–20)
Bilirubin Total: 0.4 mg/dL (ref 0.0–1.2)
CO2: 25 mmol/L (ref 18–29)
CREATININE: 0.84 mg/dL (ref 0.76–1.27)
Calcium: 9.2 mg/dL (ref 8.7–10.2)
Chloride: 102 mmol/L (ref 96–106)
GFR calc Af Amer: 141 mL/min/{1.73_m2} (ref >59)
GFR calc non Af Amer: 122 mL/min/{1.73_m2} (ref >59)
GLUCOSE: 98 mg/dL (ref 65–99)
Globulin, Total: 2.6 g/dL (ref 1.5–4.5)
Potassium: 3.8 mmol/L (ref 3.5–5.2)
Sodium: 141 mmol/L (ref 134–144)
Total Protein: 6.8 g/dL (ref 6.0–8.5)

## 2016-05-23 LAB — THYROID PANEL WITH TSH
Free Thyroxine Index: 2.3 (ref 1.2–4.9)
T3 Uptake Ratio: 33 % (ref 24–39)
T4, Total: 7 ug/dL (ref 4.5–12.0)
TSH: 1.29 u[IU]/mL (ref 0.450–4.500)

## 2016-05-23 LAB — LIPID PANEL
CHOLESTEROL TOTAL: 185 mg/dL (ref 100–199)
Chol/HDL Ratio: 7.7 ratio units — ABNORMAL HIGH (ref 0.0–5.0)
HDL: 24 mg/dL — ABNORMAL LOW (ref >39)
TRIGLYCERIDES: 499 mg/dL — AB (ref 0–149)

## 2016-05-26 ENCOUNTER — Other Ambulatory Visit: Payer: Self-pay | Admitting: Physician Assistant

## 2016-05-26 MED ORDER — FENOFIBRATE 145 MG PO TABS: 145.0000 mg | ORAL_TABLET | Freq: Every day | ORAL | 5 refills | Status: DC

## 2016-05-26 NOTE — Progress Notes (Signed)
Patient aware.

## 2016-06-08 ENCOUNTER — Telehealth: Payer: Self-pay | Admitting: Physician Assistant

## 2016-06-08 NOTE — Telephone Encounter (Signed)
Stop medicine for two weeks and then call back to report improvement fully or not. Will consider a low dose of pravastatin 20 mg one daily if the side effects are gone from Tricor.

## 2016-06-08 NOTE — Telephone Encounter (Signed)
Patient aware of recommendation and verbalizes understanding. 

## 2016-06-08 NOTE — Telephone Encounter (Signed)
Pt is having some night sweats and decreased sex drive due to Tricor. Would like to try something else Please review and advise

## 2016-06-25 ENCOUNTER — Institutional Professional Consult (permissible substitution): Payer: BLUE CROSS/BLUE SHIELD | Admitting: Neurology

## 2017-05-20 ENCOUNTER — Encounter: Payer: Self-pay | Admitting: Family Medicine

## 2017-05-20 ENCOUNTER — Ambulatory Visit: Payer: BLUE CROSS/BLUE SHIELD | Admitting: Family Medicine

## 2017-05-20 VITALS — BP 132/87 | HR 101 | Temp 97.0°F | Ht 69.0 in | Wt 238.0 lb

## 2017-05-20 DIAGNOSIS — J0100 Acute maxillary sinusitis, unspecified: Secondary | ICD-10-CM

## 2017-05-20 MED ORDER — AMOXICILLIN-POT CLAVULANATE 875-125 MG PO TABS: 1.0000 | ORAL_TABLET | Freq: Two times a day (BID) | ORAL | 0 refills | Status: AC

## 2017-05-20 NOTE — Progress Notes (Signed)
Subjective: CC: ?sinus infection PCP: Remus LofflerJones, Angel S, PA-C [facility]:QIONGEHPI:Jordan Barnett is a 27 y.o. male presenting to clinic today for:  1. Sinus infection Patient reports onset of scratchy throat, facial pressure, yellow discharge from the nares, sinus headache and voice change over the last 2-3 days.  He notes multiple sick contacts at work.  Denies fevers, chills, nausea, vomiting, diarrhea, myalgia.  He has been using DayQuil for 1 day with no improvements.   ROS: Per HPI  No Known Allergies Past Medical History:  Diagnosis Date  . Sciatica   . Thyroid disease     Current Outpatient Medications:  .  fenofibrate (TRICOR) 145 MG tablet, Take 1 tablet (145 mg total) by mouth daily. (Patient not taking: Reported on 05/20/2017), Disp: 30 tablet, Rfl: 5 .  levothyroxine (SYNTHROID, LEVOTHROID) 100 MCG tablet, Take 1 tablet (100 mcg total) by mouth daily. (Patient not taking: Reported on 05/20/2017), Disp: 30 tablet, Rfl: 2 Social History   Socioeconomic History  . Marital status: Single    Spouse name: Not on file  . Number of children: Not on file  . Years of education: Not on file  . Highest education level: Not on file  Social Needs  . Financial resource strain: Not on file  . Food insecurity - worry: Not on file  . Food insecurity - inability: Not on file  . Transportation needs - medical: Not on file  . Transportation needs - non-medical: Not on file  Occupational History  . Not on file  Tobacco Use  . Smoking status: Former Games developermoker  . Smokeless tobacco: Former Engineer, waterUser  Substance and Sexual Activity  . Alcohol use: No  . Drug use: No  . Sexual activity: Yes  Other Topics Concern  . Not on file  Social History Narrative  . Not on file   Family History  Problem Relation Age of Onset  . Hypertension Mother   . Diabetes Father     Objective: Office vital signs reviewed. BP 132/87   Pulse (!) 101   Temp (!) 97 F (36.1 C) (Oral)   Ht 5\' 9"  (1.753 m)   Wt 238 lb (108  kg)   BMI 35.15 kg/m   Physical Examination:  General: Awake, alert, well nourished, No acute distress HEENT: +frontal and maxillary sinus TTP    Neck: No masses palpated. No lymphadenopathy    Ears: Tympanic membranes intact, normal light reflex, no erythema, no bulging    Eyes: PERRLA, extraocular membranes intact, sclera white    Nose: nasal turbinates moist, yellow/opaque, creamy nasal discharge appreciated in the right nare    Throat: moist mucus membranes, moderate oropharyngeal erythema, grade 2 tonsils with no tonsillar exudate.  Airway is patent Cardio: regular rate and rhythm, S1S2 heard, no murmurs appreciated Pulm: clear to auscultation bilaterally, no wheezes, rhonchi or rales; normal work of breathing on room air  Assessment/ Plan: 27 y.o. male   1. Acute non-recurrent maxillary sinusitis Patient is afebrile and nontoxic-appearing.  His physical exam is clinically consistent with acute maxillary sinusitis.  I prescribed him Augmentin 875 p.o. twice daily for the next 10 days.  I recommended sinus rinses and instructions for use discussed with the patient and provided.  May use Sudafed if needed for persistent sinus congestion and ear fullness.  I advised him to avoid use of this prior to bedtime.  Push oral fluids.  Work note provided excusing through Monday.  Follow-up as needed.  Meds ordered this encounter  Medications  .  amoxicillin-clavulanate (AUGMENTIN) 875-125 MG tablet    Sig: Take 1 tablet by mouth 2 (two) times daily for 10 days.    Dispense:  20 tablet    Refill:  0     Ashly Hulen Skains, DO [facility] [phone]

## 2017-05-20 NOTE — Patient Instructions (Signed)
Pseudofed for sinus and ear congestion.  Get this from behind the counter at the pharmacy.  Dont take close to bed.  It can cause insomnia.  Sinus rinses recommended.  Augmentin has been sent to pharmacy.  - Get plenty of rest and drink plenty of fluids. - Try to breathe moist air. Use a cold mist humidifier. - Consume warm fluids (soup or tea) to provide relief for a stuffy nose and to loosen phlegm. - For nasal stuffiness, try saline nasal spray or a Neti Pot. Afrin nasal spray can also be used but this product should not be used longer than 3 days or it will cause rebound nasal stuffiness (worsening nasal congestion). - For sore throat pain relief: suck on throat lozenges, hard candy or popsicles; gargle with warm salt water (1/4 tsp. salt per 8 oz. of water); and eat soft, bland foods. - Eat a well-balanced diet. If you cannot, ensure you are getting enough nutrients by taking a daily multivitamin. - Avoid dairy products, as they can thicken phlegm. - Avoid alcohol, as it impairs your body's immune system.  Sinus Rinse What is a sinus rinse? A sinus rinse is a simple home treatment that is used to rinse your sinuses with a sterile mixture of salt and water (saline solution). Sinuses are air-filled spaces in your skull behind the bones of your face and forehead that open into your nasal cavity. You will use the following:  Saline solution.  Neti pot or spray bottle. This releases the saline solution into your nose and through your sinuses. Neti pots and spray bottles can be purchased at [facility] local pharmacy, a health food store, or online.  When would I do a sinus rinse? A sinus rinse can help to clear mucus, dirt, dust, or pollen from the nasal cavity. You may do a sinus rinse when you have a cold, a virus, nasal allergy symptoms, a sinus infection, or stuffiness in the nose or sinuses. If you are considering a sinus rinse:  Ask your child's health care provider before performing a sinus  rinse on your child.  Do not do a sinus rinse if you have had ear or nasal surgery, ear infection, or blocked ears.  How do I do a sinus rinse?  Wash your hands.  Disinfect your device according to the directions provided and then dry it.  Use the solution that comes with your device or one that is sold separately in stores. Follow the mixing directions on the package.  Fill your device with the amount of saline solution as directed by the device instructions.  Stand over a sink and tilt your head sideways over the sink.  Place the spout of the device in your upper nostril (the one closer to the ceiling).  Gently pour or squeeze the saline solution into the nasal cavity. The liquid should drain to the lower nostril if you are not overly congested.  Gently blow your nose. Blowing too hard may cause ear pain.  Repeat in the other nostril.  Clean and rinse your device with clean water and then air-dry it. Are there risks of a sinus rinse? Sinus rinse is generally very safe and effective. However, there are a few risks, which include:  A burning sensation in the sinuses. This may happen if you do not make the saline solution as directed. Make sure to follow all directions when making the saline solution.  Infection from contaminated water. This is rare, but possible.  Nasal irritation.  This  information is not intended to replace advice given to you by your health care provider. Make sure you discuss any questions you have with your health care provider. Document Released: 09/27/2013 Document Revised: 01/28/2016 Document Reviewed: 07/18/2013 Elsevier Interactive Patient Education  2017 [employer].

## 2017-06-01 ENCOUNTER — Encounter: Payer: Self-pay | Admitting: Physician Assistant

## 2017-06-01 ENCOUNTER — Ambulatory Visit: Payer: BLUE CROSS/BLUE SHIELD | Admitting: Physician Assistant

## 2017-06-01 VITALS — BP 128/85 | HR 77 | Temp 98.2°F | Ht 69.0 in | Wt 233.2 lb

## 2017-06-01 DIAGNOSIS — E039 Hypothyroidism, unspecified: Secondary | ICD-10-CM

## 2017-06-01 DIAGNOSIS — Z0000 Encounter for general adult medical examination without abnormal findings: Secondary | ICD-10-CM

## 2017-06-01 MED ORDER — LEVOTHYROXINE SODIUM 100 MCG PO TABS: 100.0000 ug | ORAL_TABLET | Freq: Every day | ORAL | 2 refills | Status: DC

## 2017-06-01 NOTE — Progress Notes (Signed)
BP 128/85   Pulse 77   Temp 98.2 F (36.8 C) (Oral)   Ht '5\' 9"'  (1.753 m)   Wt 233 lb 4 oz (105.8 kg)   BMI 34.45 kg/m    Subjective:    Patient ID: Jordan Barnett, male    DOB: 1990-09-27, 27 y.o.   MRN: [id number]  HPI: Alroy Portela is a 27 y.o. male presenting on 06/01/2017 for Medication Refill  Patient comes in for recheck on his medications and labs.  He states he had some insurance issues and was not able to get his medications consistently.  He has not been taking them for about a month or 2.  I have encouraged him to get back on the thyroid medication and we will draw labs after that.  At this time we note will be office.  He was also unable to tolerate fenofibrate.  So we will wait and see what his cholesterol is with his dietary measures.  Past Medical History:  Diagnosis Date  . Sciatica   . Thyroid disease    Relevant past medical, surgical, family and social history reviewed and updated as indicated. Interim medical history since our last visit reviewed. Allergies and medications reviewed and updated. DATA REVIEWED: CHART IN EPIC  Family History reviewed for pertinent findings.  Review of Systems  Constitutional: Negative.  Negative for appetite change and fatigue.  Eyes: Negative for pain and visual disturbance.  Respiratory: Negative.  Negative for cough, chest tightness, shortness of breath and wheezing.   Cardiovascular: Negative.  Negative for chest pain, palpitations and leg swelling.  Gastrointestinal: Negative.  Negative for abdominal pain, diarrhea, nausea and vomiting.  Genitourinary: Negative.   Skin: Negative.  Negative for color change and rash.  Neurological: Negative.  Negative for weakness, numbness and headaches.  Psychiatric/Behavioral: Negative.     Allergies as of 06/01/2017   No Known Allergies     Medication List        Accurate as of 06/01/17 11:59 PM. Always use your most recent med list.          levothyroxine 100 MCG  tablet Commonly known as:  SYNTHROID, LEVOTHROID Take 1 tablet (100 mcg total) by mouth daily.          Objective:    BP 128/85   Pulse 77   Temp 98.2 F (36.8 C) (Oral)   Ht '5\' 9"'  (1.753 m)   Wt 233 lb 4 oz (105.8 kg)   BMI 34.45 kg/m   No Known Allergies  Wt Readings from Last 3 Encounters:  06/01/17 233 lb 4 oz (105.8 kg)  05/20/17 238 lb (108 kg)  05/22/16 229 lb 9.6 oz (104.1 kg)    Physical Exam  Constitutional: He appears well-developed and well-nourished. No distress.  HENT:  Head: Normocephalic and atraumatic.  Eyes: Pupils are equal, round, and reactive to light. Conjunctivae and EOM are normal.  Cardiovascular: Normal rate, regular rhythm and normal heart sounds.  Pulmonary/Chest: Effort normal and breath sounds normal. No respiratory distress.  Skin: Skin is warm and dry.  Psychiatric: He has a normal mood and affect. His behavior is normal.  Nursing note and vitals reviewed.   Results for orders placed or performed in visit on 05/22/16  CBC with Differential/Platelet  Result Value Ref Range   WBC 6.2 3.4 - 10.8 x10E3/uL   RBC 4.83 4.14 - 5.80 x10E6/uL   Hemoglobin 14.7 13.0 - 17.7 g/dL   Hematocrit 42.9 37.5 - 51.0 %  MCV 89 79 - 97 fL   MCH 30.4 26.6 - 33.0 pg   MCHC 34.3 31.5 - 35.7 g/dL   RDW 14.2 12.3 - 15.4 %   Platelets 295 150 - 379 x10E3/uL   Neutrophils 54 Not Estab. %   Lymphs 34 Not Estab. %   Monocytes 10 Not Estab. %   Eos 2 Not Estab. %   Basos 0 Not Estab. %   Neutrophils Absolute 3.4 1.4 - 7.0 x10E3/uL   Lymphocytes Absolute 2.1 0.7 - 3.1 x10E3/uL   Monocytes Absolute 0.6 0.1 - 0.9 x10E3/uL   EOS (ABSOLUTE) 0.1 0.0 - 0.4 x10E3/uL   Basophils Absolute 0.0 0.0 - 0.2 x10E3/uL   Immature Granulocytes 0 Not Estab. %   Immature Grans (Abs) 0.0 0.0 - 0.1 x10E3/uL  CMP14+EGFR  Result Value Ref Range   Glucose 98 65 - 99 mg/dL   BUN 12 6 - 20 mg/dL   Creatinine, Ser 0.84 0.76 - 1.27 mg/dL   GFR calc non Af Amer 122 >59 mL/min/1.73    GFR calc Af Amer 141 >59 mL/min/1.73   BUN/Creatinine Ratio 14 9 - 20   Sodium 141 134 - 144 mmol/L   Potassium 3.8 3.5 - 5.2 mmol/L   Chloride 102 96 - 106 mmol/L   CO2 25 18 - 29 mmol/L   Calcium 9.2 8.7 - 10.2 mg/dL   Total Protein 6.8 6.0 - 8.5 g/dL   Albumin 4.2 3.5 - 5.5 g/dL   Globulin, Total 2.6 1.5 - 4.5 g/dL   Albumin/Globulin Ratio 1.6 1.2 - 2.2   Bilirubin Total 0.4 0.0 - 1.2 mg/dL   Alkaline Phosphatase 66 39 - 117 IU/L   AST 17 0 - 40 IU/L   ALT 26 0 - 44 IU/L  Lipid panel  Result Value Ref Range   Cholesterol, Total 185 100 - 199 mg/dL   Triglycerides 499 (H) 0 - 149 mg/dL   HDL 24 (L) >39 mg/dL   VLDL Cholesterol Cal Comment 5 - 40 mg/dL   LDL Calculated Comment 0 - 99 mg/dL   Chol/HDL Ratio 7.7 (H) 0.0 - 5.0 ratio units  Thyroid Panel With TSH  Result Value Ref Range   TSH 1.290 0.450 - 4.500 uIU/mL   T4, Total 7.0 4.5 - 12.0 ug/dL   T3 Uptake Ratio 33 24 - 39 %   Free Thyroxine Index 2.3 1.2 - 4.9      Assessment & Plan:   1. Well adult exam - CBC with Differential/Platelet; Future - CMP14+EGFR; Future - Lipid panel; Future - TSH; Future  2. Hypothyroidism - TSH; Future - levothyroxine (SYNTHROID, LEVOTHROID) 100 MCG tablet; Take 1 tablet (100 mcg total) by mouth daily.  Dispense: 30 tablet; Refill: 2   Continue all other maintenance medications as listed above.  Follow up plan: Return for labs 1 month.  Educational handout given for [place] PA-C [facility] [address]  [place], [place] [address] [phone]   06/03/2017, 8:29 PM

## 2017-06-01 NOTE — Patient Instructions (Signed)
In a few days you may receive a survey in the mail or online from [employer] regarding your visit with us today. Please take a moment to fill this out. Your feedback is very important to our whole office. It can help us better understand your needs as well as improve your experience and satisfaction. Thank you for taking your time to complete it. We care about you.  Sharvil Hoey, PA-C  

## 2017-07-01 ENCOUNTER — Encounter: Payer: Self-pay | Admitting: Physician Assistant

## 2017-07-01 ENCOUNTER — Ambulatory Visit: Payer: BLUE CROSS/BLUE SHIELD | Admitting: Physician Assistant

## 2017-07-01 DIAGNOSIS — Z0000 Encounter for general adult medical examination without abnormal findings: Secondary | ICD-10-CM

## 2017-07-01 DIAGNOSIS — E039 Hypothyroidism, unspecified: Secondary | ICD-10-CM | POA: Diagnosis not present

## 2017-07-02 LAB — CBC WITH DIFFERENTIAL/PLATELET
BASOS: 1 %
Basophils Absolute: 0 10*3/uL (ref 0.0–0.2)
EOS (ABSOLUTE): 0.1 10*3/uL (ref 0.0–0.4)
EOS: 1 %
HEMOGLOBIN: 15 g/dL (ref 13.0–17.7)
Hematocrit: 44.3 % (ref 37.5–51.0)
IMMATURE GRANS (ABS): 0 10*3/uL (ref 0.0–0.1)
Immature Granulocytes: 0 %
LYMPHS: 31 %
Lymphocytes Absolute: 1.9 10*3/uL (ref 0.7–3.1)
MCH: 30.1 pg (ref 26.6–33.0)
MCHC: 33.9 g/dL (ref 31.5–35.7)
MCV: 89 fL (ref 79–97)
MONOCYTES: 8 %
Monocytes Absolute: 0.5 10*3/uL (ref 0.1–0.9)
Neutrophils Absolute: 3.6 10*3/uL (ref 1.4–7.0)
Neutrophils: 59 %
Platelets: 269 10*3/uL (ref 150–379)
RBC: 4.98 x10E6/uL (ref 4.14–5.80)
RDW: 14 % (ref 12.3–15.4)
WBC: 6.1 10*3/uL (ref 3.4–10.8)

## 2017-07-02 LAB — CMP14+EGFR
ALT: 29 IU/L (ref 0–44)
AST: 15 IU/L (ref 0–40)
Albumin/Globulin Ratio: 1.6 (ref 1.2–2.2)
Albumin: 4.2 g/dL (ref 3.5–5.5)
Alkaline Phosphatase: 71 IU/L (ref 39–117)
BUN/Creatinine Ratio: 17 (ref 9–20)
BUN: 18 mg/dL (ref 6–20)
Bilirubin Total: <0.2 mg/dL (ref 0.0–1.2)
CO2: 23 mmol/L (ref 20–29)
Calcium: 9.2 mg/dL (ref 8.7–10.2)
Chloride: 104 mmol/L (ref 96–106)
Creatinine, Ser: 1.03 mg/dL (ref 0.76–1.27)
GFR, EST AFRICAN AMERICAN: 115 mL/min/{1.73_m2} (ref >59)
GFR, EST NON AFRICAN AMERICAN: 100 mL/min/{1.73_m2} (ref >59)
Globulin, Total: 2.7 g/dL (ref 1.5–4.5)
Glucose: 99 mg/dL (ref 65–99)
Potassium: 4.3 mmol/L (ref 3.5–5.2)
Sodium: 141 mmol/L (ref 134–144)
Total Protein: 6.9 g/dL (ref 6.0–8.5)

## 2017-07-02 LAB — LIPID PANEL
CHOL/HDL RATIO: 7 ratio — AB (ref 0.0–5.0)
Cholesterol, Total: 183 mg/dL (ref 100–199)
HDL: 26 mg/dL — ABNORMAL LOW (ref >39)
LDL Calculated: 86 mg/dL (ref 0–99)
TRIGLYCERIDES: 356 mg/dL — AB (ref 0–149)
VLDL CHOLESTEROL CAL: 71 mg/dL — AB (ref 5–40)

## 2017-07-02 LAB — TSH: TSH: 2.17 u[IU]/mL (ref 0.450–4.500)

## 2017-07-04 NOTE — Progress Notes (Signed)
BP 114/73   Pulse 73   Temp 98.3 F (36.8 C) (Oral)   Ht '5\' 9"'  (1.753 m)   Wt 229 lb 3.2 oz (104 kg)   BMI 33.85 kg/m    Subjective:    Patient ID: Jordan Barnett, male    DOB: Nov 22, 1990, 27 y.o.   MRN: [id number]  HPI: Jordan Barnett is a 27 y.o. male presenting on 07/01/2017 for Follow-up (1 month )  The patient comes in for recheck on his conditions.  He is doing well with his medications.  He does need labs today to see if his thyroid is balanced now or not.  They will be performed.  Refills will be sent as needed.  Past Medical History:  Diagnosis Date  . Sciatica   . Thyroid disease    Relevant past medical, surgical, family and social history reviewed and updated as indicated. Interim medical history since our last visit reviewed. Allergies and medications reviewed and updated. DATA REVIEWED: CHART IN EPIC  Family History reviewed for pertinent findings.  Review of Systems  Constitutional: Negative.  Negative for appetite change and fatigue.  Eyes: Negative for pain and visual disturbance.  Respiratory: Negative.  Negative for cough, chest tightness, shortness of breath and wheezing.   Cardiovascular: Negative.  Negative for chest pain, palpitations and leg swelling.  Gastrointestinal: Negative.  Negative for abdominal pain, diarrhea, nausea and vomiting.  Genitourinary: Negative.   Skin: Negative.  Negative for color change and rash.  Neurological: Negative.  Negative for weakness, numbness and headaches.  Psychiatric/Behavioral: Negative.     Allergies as of 07/01/2017      Reactions   Fenofibrate Micronized    Sexual dysfunc      Medication List        Accurate as of 07/01/17 11:59 PM. Always use your most recent med list.          levothyroxine 100 MCG tablet Commonly known as:  SYNTHROID, LEVOTHROID Take 1 tablet (100 mcg total) by mouth daily.          Objective:    BP 114/73   Pulse 73   Temp 98.3 F (36.8 C) (Oral)   Ht '5\' 9"'   (1.753 m)   Wt 229 lb 3.2 oz (104 kg)   BMI 33.85 kg/m   Allergies  Allergen Reactions  . Fenofibrate Micronized     Sexual dysfunc    Wt Readings from Last 3 Encounters:  07/01/17 229 lb 3.2 oz (104 kg)  06/01/17 233 lb 4 oz (105.8 kg)  05/20/17 238 lb (108 kg)    Physical Exam  Constitutional: He appears well-developed and well-nourished. No distress.  HENT:  Head: Normocephalic and atraumatic.  Eyes: Pupils are equal, round, and reactive to light. Conjunctivae and EOM are normal.  Cardiovascular: Normal rate, regular rhythm and normal heart sounds.  Pulmonary/Chest: Effort normal and breath sounds normal. No respiratory distress.  Skin: Skin is warm and dry.  Psychiatric: He has a normal mood and affect. His behavior is normal.  Nursing note and vitals reviewed.   Results for orders placed or performed in visit on 07/01/17  TSH  Result Value Ref Range   TSH 2.170 0.450 - 4.500 uIU/mL  Lipid panel  Result Value Ref Range   Cholesterol, Total 183 100 - 199 mg/dL   Triglycerides 356 (H) 0 - 149 mg/dL   HDL 26 (L) >39 mg/dL   VLDL Cholesterol Cal 71 (H) 5 - 40 mg/dL   LDL  Calculated 86 0 - 99 mg/dL   Chol/HDL Ratio 7.0 (H) 0.0 - 5.0 ratio  CMP14+EGFR  Result Value Ref Range   Glucose 99 65 - 99 mg/dL   BUN 18 6 - 20 mg/dL   Creatinine, Ser 1.03 0.76 - 1.27 mg/dL   GFR calc non Af Amer 100 >59 mL/min/1.73   GFR calc Af Amer 115 >59 mL/min/1.73   BUN/Creatinine Ratio 17 9 - 20   Sodium 141 134 - 144 mmol/L   Potassium 4.3 3.5 - 5.2 mmol/L   Chloride 104 96 - 106 mmol/L   CO2 23 20 - 29 mmol/L   Calcium 9.2 8.7 - 10.2 mg/dL   Total Protein 6.9 6.0 - 8.5 g/dL   Albumin 4.2 3.5 - 5.5 g/dL   Globulin, Total 2.7 1.5 - 4.5 g/dL   Albumin/Globulin Ratio 1.6 1.2 - 2.2   Bilirubin Total <0.2 0.0 - 1.2 mg/dL   Alkaline Phosphatase 71 39 - 117 IU/L   AST 15 0 - 40 IU/L   ALT 29 0 - 44 IU/L  CBC with Differential/Platelet  Result Value Ref Range   WBC 6.1 3.4 - 10.8  x10E3/uL   RBC 4.98 4.14 - 5.80 x10E6/uL   Hemoglobin 15.0 13.0 - 17.7 g/dL   Hematocrit 44.3 37.5 - 51.0 %   MCV 89 79 - 97 fL   MCH 30.1 26.6 - 33.0 pg   MCHC 33.9 31.5 - 35.7 g/dL   RDW 14.0 12.3 - 15.4 %   Platelets 269 150 - 379 x10E3/uL   Neutrophils 59 Not Estab. %   Lymphs 31 Not Estab. %   Monocytes 8 Not Estab. %   Eos 1 Not Estab. %   Basos 1 Not Estab. %   Neutrophils Absolute 3.6 1.4 - 7.0 x10E3/uL   Lymphocytes Absolute 1.9 0.7 - 3.1 x10E3/uL   Monocytes Absolute 0.5 0.1 - 0.9 x10E3/uL   EOS (ABSOLUTE) 0.1 0.0 - 0.4 x10E3/uL   Basophils Absolute 0.0 0.0 - 0.2 x10E3/uL   Immature Granulocytes 0 Not Estab. %   Immature Grans (Abs) 0.0 0.0 - 0.1 x10E3/uL      Assessment & Plan:   1. Well adult exam - TSH - Lipid panel - CMP14+EGFR - CBC with Differential/Platelet  2. Hypothyroidism, unspecified type - TSH   Continue all other maintenance medications as listed above.  Follow up plan: No follow-ups on file.  Educational handout given for [place] PA-C [facility] [address]  [place], [place] [address] [phone]   07/04/2017, 11:18 PM

## 2017-07-12 ENCOUNTER — Other Ambulatory Visit: Payer: Self-pay | Admitting: Physician Assistant

## 2017-07-12 MED ORDER — AMOXICILLIN 500 MG PO CAPS: 500.0000 mg | ORAL_CAPSULE | Freq: Three times a day (TID) | ORAL | 0 refills | Status: DC

## 2017-10-25 DIAGNOSIS — M545 Low back pain: Secondary | ICD-10-CM | POA: Diagnosis not present

## 2017-10-26 ENCOUNTER — Other Ambulatory Visit: Payer: Self-pay | Admitting: Physician Assistant

## 2017-10-26 DIAGNOSIS — E039 Hypothyroidism, unspecified: Secondary | ICD-10-CM

## 2017-12-06 DIAGNOSIS — M5416 Radiculopathy, lumbar region: Secondary | ICD-10-CM | POA: Diagnosis not present

## 2017-12-06 DIAGNOSIS — Z9889 Other specified postprocedural states: Secondary | ICD-10-CM | POA: Diagnosis not present

## 2017-12-06 DIAGNOSIS — M545 Low back pain: Secondary | ICD-10-CM | POA: Diagnosis not present

## 2018-02-08 ENCOUNTER — Ambulatory Visit (INDEPENDENT_AMBULATORY_CARE_PROVIDER_SITE_OTHER): Payer: 59

## 2018-02-08 DIAGNOSIS — Z23 Encounter for immunization: Secondary | ICD-10-CM

## 2018-03-11 ENCOUNTER — Other Ambulatory Visit: Payer: Self-pay | Admitting: Physician Assistant

## 2018-03-11 DIAGNOSIS — E039 Hypothyroidism, unspecified: Secondary | ICD-10-CM

## 2018-03-30 ENCOUNTER — Encounter: Payer: Self-pay | Admitting: Family Medicine

## 2018-03-30 ENCOUNTER — Ambulatory Visit: Payer: 59 | Admitting: Family Medicine

## 2018-03-30 VITALS — BP 122/89 | HR 88 | Temp 98.4°F | Ht 69.0 in | Wt 238.0 lb

## 2018-03-30 DIAGNOSIS — H66009 Acute suppurative otitis media without spontaneous rupture of ear drum, unspecified ear: Secondary | ICD-10-CM

## 2018-03-30 DIAGNOSIS — J0100 Acute maxillary sinusitis, unspecified: Secondary | ICD-10-CM | POA: Diagnosis not present

## 2018-03-30 MED ORDER — PSEUDOEPHEDRINE-GUAIFENESIN ER 120-1200 MG PO TB12
1.0000 | ORAL_TABLET | Freq: Two times a day (BID) | ORAL | 0 refills | Status: DC
Start: 2018-03-30 — End: 2018-07-19

## 2018-03-30 MED ORDER — AMOXICILLIN-POT CLAVULANATE 875-125 MG PO TABS: 1.0000 | ORAL_TABLET | Freq: Two times a day (BID) | ORAL | 0 refills | Status: DC

## 2018-03-30 NOTE — Progress Notes (Signed)
Chief Complaint  Patient presents with  . Sore Throat    pt here today c/o sore throat, drainage and left ear pain    HPI  Patient presents today for Symptoms include congestion, facial pain, nasal congestion, non productive cough, post nasal drip and sinus pressure. There is no fever, chills, or sweats. Onset of symptoms was a few days ago, gradually worsening since that time.    PMH: Smoking status noted ROS: Per HPI  Objective: BP 122/89   Pulse 88   Temp 98.4 F (36.9 C) (Oral)   Ht 5\' 9"  (1.753 m)   Wt 238 lb (108 kg)   BMI 35.15 kg/m  Gen: NAD, alert, cooperative with exam HEENT: NCAT, EOMI, PERRL. R TM red, left with effusion. Max sinuses tender CV: RRR, good S1/S2, no murmur Resp: CTABL, no wheezes, non-labored Abd: SNTND, BS present, no guarding or organomegaly Ext: No edema, warm Neuro: Alert and oriented, No gross deficits  Assessment and plan:  1. Acute maxillary sinusitis, recurrence not specified   2. Acute suppurative otitis media without spontaneous rupture of ear drum, recurrence not specified, unspecified laterality     Meds ordered this encounter  Medications  . amoxicillin-clavulanate (AUGMENTIN) 875-125 MG tablet    Sig: Take 1 tablet by mouth 2 (two) times daily. Take all of this medication    Dispense:  20 tablet    Refill:  0  . Pseudoephedrine-Guaifenesin [phone] MG TB12    Sig: Take 1 tablet by mouth 2 (two) times daily. For congestion    Dispense:  20 each    Refill:  0    No orders of the defined types were placed in this encounter.   Follow up as needed.  Mechele Claude, MD

## 2018-07-19 ENCOUNTER — Other Ambulatory Visit: Payer: Self-pay

## 2018-07-19 ENCOUNTER — Ambulatory Visit (INDEPENDENT_AMBULATORY_CARE_PROVIDER_SITE_OTHER): Payer: 59 | Admitting: Physician Assistant

## 2018-07-19 ENCOUNTER — Encounter: Payer: Self-pay | Admitting: Physician Assistant

## 2018-07-19 VITALS — BP 130/89 | HR 82 | Temp 98.7°F | Ht 69.0 in | Wt 245.6 lb

## 2018-07-19 DIAGNOSIS — F320 Major depressive disorder, single episode, mild: Secondary | ICD-10-CM

## 2018-07-19 DIAGNOSIS — E039 Hypothyroidism, unspecified: Secondary | ICD-10-CM

## 2018-07-19 DIAGNOSIS — F411 Generalized anxiety disorder: Secondary | ICD-10-CM

## 2018-07-19 MED ORDER — ESCITALOPRAM OXALATE 10 MG PO TABS: 10.0000 mg | ORAL_TABLET | Freq: Every day | ORAL | 5 refills | Status: DC

## 2018-07-19 MED ORDER — ALPRAZOLAM 0.25 MG PO TABS: 0.2500 mg | ORAL_TABLET | Freq: Two times a day (BID) | ORAL | 0 refills | Status: DC | PRN

## 2018-07-19 NOTE — Progress Notes (Signed)
BP 130/89   Pulse 82   Temp 98.7 F (37.1 C) (Oral)   Ht '5\' 9"'  (1.753 m)   Wt 245 lb 9.6 oz (111.4 kg)   BMI 36.27 kg/m    Subjective:    Patient ID: Jordan Barnett, male    DOB: Jul 04, 1990, 28 y.o.   MRN: [id number]  HPI: Jordan Barnett is a 28 y.o. male presenting on 07/19/2018 for Anxiety and Depression  This patient comes in for issues with depression and anxiety.  He has never been diagnosed with this in the past.  His mother does have depression.  He is currently the only one working.  His wife is home with their new baby.  He is just feeling quite overwhelmed with everything that is going on.  Plus the concern of the COVID-19.  He had an episode at work last week where he was physically shaking and could not calm himself down. He does have hypothyroidism and we should look at this for a cause also.  Depression screen [facility] 2/9 07/19/2018 03/30/2018 07/01/2017 05/20/2017 05/22/2016  Decreased Interest 3 0 0 0 0  Down, Depressed, Hopeless 2 0 0 0 0  PHQ - 2 Score 5 0 0 0 0  Altered sleeping 2 - - - -  Tired, decreased energy 3 - - - -  Change in appetite 1 - - - -  Feeling bad or failure about yourself  0 - - - -  Trouble concentrating 1 - - - -  Moving slowly or fidgety/restless 1 - - - -  Suicidal thoughts 0 - - - -  PHQ-9 Score 13 - - - -   GAD 7 : Generalized Anxiety Score 07/19/2018  Nervous, Anxious, on Edge 2  Control/stop worrying 3  Worry too much - different things 2  Trouble relaxing 2  Restless 1  Easily annoyed or irritable 3  Afraid - awful might happen 3  Total GAD 7 Score 16      Past Medical History:  Diagnosis Date  . Sciatica   . Thyroid disease    Relevant past medical, surgical, family and social history reviewed and updated as indicated. Interim medical history since our last visit reviewed. Allergies and medications reviewed and updated. DATA REVIEWED: CHART IN EPIC  Family History reviewed for pertinent findings.  Review of Systems   Constitutional: Negative.  Negative for appetite change and fatigue.  Eyes: Negative for pain and visual disturbance.  Respiratory: Negative.  Negative for cough, chest tightness, shortness of breath and wheezing.   Cardiovascular: Negative.  Negative for chest pain, palpitations and leg swelling.  Gastrointestinal: Negative.  Negative for abdominal pain, diarrhea, nausea and vomiting.  Genitourinary: Negative.   Skin: Negative.  Negative for color change and rash.  Neurological: Negative.  Negative for weakness, numbness and headaches.  Psychiatric/Behavioral: Positive for decreased concentration, dysphoric mood and sleep disturbance. Negative for self-injury and suicidal ideas. The patient is nervous/anxious.     Allergies as of 07/19/2018      Reactions   Fenofibrate Micronized    Sexual dysfunc      Medication List       Accurate as of Jul 19, 2018  9:52 AM. Always use your most recent med list.        ALPRAZolam 0.25 MG tablet Commonly known as:  XANAX Take 1 tablet (0.25 mg total) by mouth 2 (two) times daily as needed for anxiety.   escitalopram 10 MG tablet Commonly known as:  Lexapro  Take 1 tablet (10 mg total) by mouth daily.   levothyroxine 100 MCG tablet Commonly known as:  SYNTHROID TAKE ONE (1) TABLET EACH DAY          Objective:    BP 130/89   Pulse 82   Temp 98.7 F (37.1 C) (Oral)   Ht '5\' 9"'  (1.753 m)   Wt 245 lb 9.6 oz (111.4 kg)   BMI 36.27 kg/m   Allergies  Allergen Reactions  . Fenofibrate Micronized     Sexual dysfunc    Wt Readings from Last 3 Encounters:  07/19/18 245 lb 9.6 oz (111.4 kg)  03/30/18 238 lb (108 kg)  07/01/17 229 lb 3.2 oz (104 kg)    Physical Exam Vitals signs and nursing note reviewed.  Constitutional:      General: He is not in acute distress.    Appearance: He is well-developed.  HENT:     Head: Normocephalic and atraumatic.  Eyes:     Conjunctiva/sclera: Conjunctivae normal.     Pupils: Pupils are equal,  round, and reactive to light.  Cardiovascular:     Rate and Rhythm: Normal rate and regular rhythm.     Heart sounds: Normal heart sounds.  Pulmonary:     Effort: Pulmonary effort is normal. No respiratory distress.     Breath sounds: Normal breath sounds.  Skin:    General: Skin is warm and dry.  Psychiatric:        Behavior: Behavior normal.     Results for orders placed or performed in visit on 07/01/17  TSH  Result Value Ref Range   TSH 2.170 0.450 - 4.500 uIU/mL  Lipid panel  Result Value Ref Range   Cholesterol, Total 183 100 - 199 mg/dL   Triglycerides 356 (H) 0 - 149 mg/dL   HDL 26 (L) >39 mg/dL   VLDL Cholesterol Cal 71 (H) 5 - 40 mg/dL   LDL Calculated 86 0 - 99 mg/dL   Chol/HDL Ratio 7.0 (H) 0.0 - 5.0 ratio  CMP14+EGFR  Result Value Ref Range   Glucose 99 65 - 99 mg/dL   BUN 18 6 - 20 mg/dL   Creatinine, Ser 1.03 0.76 - 1.27 mg/dL   GFR calc non Af Amer 100 >59 mL/min/1.73   GFR calc Af Amer 115 >59 mL/min/1.73   BUN/Creatinine Ratio 17 9 - 20   Sodium 141 134 - 144 mmol/L   Potassium 4.3 3.5 - 5.2 mmol/L   Chloride 104 96 - 106 mmol/L   CO2 23 20 - 29 mmol/L   Calcium 9.2 8.7 - 10.2 mg/dL   Total Protein 6.9 6.0 - 8.5 g/dL   Albumin 4.2 3.5 - 5.5 g/dL   Globulin, Total 2.7 1.5 - 4.5 g/dL   Albumin/Globulin Ratio 1.6 1.2 - 2.2   Bilirubin Total <0.2 0.0 - 1.2 mg/dL   Alkaline Phosphatase 71 39 - 117 IU/L   AST 15 0 - 40 IU/L   ALT 29 0 - 44 IU/L  CBC with Differential/Platelet  Result Value Ref Range   WBC 6.1 3.4 - 10.8 x10E3/uL   RBC 4.98 4.14 - 5.80 x10E6/uL   Hemoglobin 15.0 13.0 - 17.7 g/dL   Hematocrit 44.3 37.5 - 51.0 %   MCV 89 79 - 97 fL   MCH 30.1 26.6 - 33.0 pg   MCHC 33.9 31.5 - 35.7 g/dL   RDW 14.0 12.3 - 15.4 %   Platelets 269 150 - 379 x10E3/uL   Neutrophils 59 Not  Estab. %   Lymphs 31 Not Estab. %   Monocytes 8 Not Estab. %   Eos 1 Not Estab. %   Basos 1 Not Estab. %   Neutrophils Absolute 3.6 1.4 - 7.0 x10E3/uL   Lymphocytes  Absolute 1.9 0.7 - 3.1 x10E3/uL   Monocytes Absolute 0.5 0.1 - 0.9 x10E3/uL   EOS (ABSOLUTE) 0.1 0.0 - 0.4 x10E3/uL   Basophils Absolute 0.0 0.0 - 0.2 x10E3/uL   Immature Granulocytes 0 Not Estab. %   Immature Grans (Abs) 0.0 0.0 - 0.1 x10E3/uL      Assessment & Plan:   1. Depression, major, single episode, mild (HCC) - escitalopram (LEXAPRO) 10 MG tablet; Take 1 tablet (10 mg total) by mouth daily.  Dispense: 30 tablet; Refill: 5 - CMP14+EGFR  2. GAD (generalized anxiety disorder) - TSH - escitalopram (LEXAPRO) 10 MG tablet; Take 1 tablet (10 mg total) by mouth daily.  Dispense: 30 tablet; Refill: 5 - ALPRAZolam (XANAX) 0.25 MG tablet; Take 1 tablet (0.25 mg total) by mouth 2 (two) times daily as needed for anxiety.  Dispense: 20 tablet; Refill: 0 - CMP14+EGFR  3. Hypothyroidism, unspecified type - TSH   Continue all other maintenance medications as listed above.  Follow up plan: Return in about 4 weeks (around 08/16/2018) for recheck.  Educational handout given for anxiety  Terald Sleeper PA-C [facility] [address]  [place],  [address] [phone]   07/19/2018, 9:52 AM

## 2018-07-19 NOTE — Patient Instructions (Signed)

## 2018-07-20 ENCOUNTER — Ambulatory Visit: Payer: 59 | Admitting: Physician Assistant

## 2018-07-20 ENCOUNTER — Encounter: Payer: Self-pay | Admitting: Physician Assistant

## 2018-07-20 ENCOUNTER — Ambulatory Visit (INDEPENDENT_AMBULATORY_CARE_PROVIDER_SITE_OTHER): Payer: 59 | Admitting: Physician Assistant

## 2018-07-20 DIAGNOSIS — F411 Generalized anxiety disorder: Secondary | ICD-10-CM

## 2018-07-20 DIAGNOSIS — F320 Major depressive disorder, single episode, mild: Secondary | ICD-10-CM | POA: Diagnosis not present

## 2018-07-20 LAB — CMP14+EGFR
ALT: 37 IU/L (ref 0–44)
AST: 22 IU/L (ref 0–40)
Albumin/Globulin Ratio: 1.9 (ref 1.2–2.2)
Albumin: 4.7 g/dL (ref 4.1–5.2)
Alkaline Phosphatase: 69 IU/L (ref 39–117)
BUN/Creatinine Ratio: 14 (ref 9–20)
BUN: 14 mg/dL (ref 6–20)
Bilirubin Total: 0.4 mg/dL (ref 0.0–1.2)
CO2: 23 mmol/L (ref 20–29)
Calcium: 9.7 mg/dL (ref 8.7–10.2)
Chloride: 100 mmol/L (ref 96–106)
Creatinine, Ser: 1.02 mg/dL (ref 0.76–1.27)
GFR calc Af Amer: 116 mL/min/{1.73_m2} (ref >59)
GFR calc non Af Amer: 100 mL/min/{1.73_m2} (ref >59)
Globulin, Total: 2.5 g/dL (ref 1.5–4.5)
Glucose: 89 mg/dL (ref 65–99)
Potassium: 4.1 mmol/L (ref 3.5–5.2)
Sodium: 139 mmol/L (ref 134–144)
Total Protein: 7.2 g/dL (ref 6.0–8.5)

## 2018-07-20 LAB — TSH: TSH: 3 u[IU]/mL (ref 0.450–4.500)

## 2018-07-20 MED ORDER — ESCITALOPRAM OXALATE 10 MG PO TABS: 10.0000 mg | ORAL_TABLET | Freq: Every day | ORAL | 5 refills | Status: DC

## 2018-07-20 NOTE — Progress Notes (Signed)
    Telephone visit  Subjective: CC: Side effect of Celexa PCP: Remus Loffler, PA-C QDI:YMEBRA Jordan Barnett is a 28 y.o. male calls for telephone consult today. Patient provides verbal consent for consult held via phone.  Patient is identified with 2 separate identifiers.  At this time the entire area is on COVID-19 social distancing and stay home orders are in place.  Patient is of higher risk and therefore we are performing this by a virtual method.  Location of patient: home Location of provider: HOME Others present for call: No  The patient took the Celexa this morning with his other medications and only some tea.  He took the medicine at 555.  He went to work.  And at 6:38 PM he vomited.  He was concerned that the medication was causing the nausea.  We discussed switching the medication altogether to Prozac.  And we will keep that as the future.  However he would like to try the medication again and take it with food.   ROS: Per HPI  Allergies  Allergen Reactions  . Fenofibrate Micronized     Sexual dysfunc   Past Medical History:  Diagnosis Date  . Sciatica   . Thyroid disease     Current Outpatient Medications:  .  ALPRAZolam (XANAX) 0.25 MG tablet, Take 1 tablet (0.25 mg total) by mouth 2 (two) times daily as needed for anxiety., Disp: 20 tablet, Rfl: 0 .  escitalopram (LEXAPRO) 10 MG tablet, Take 1 tablet (10 mg total) by mouth daily., Disp: 30 tablet, Rfl: 5 .  levothyroxine (SYNTHROID, LEVOTHROID) 100 MCG tablet, TAKE ONE (1) TABLET EACH DAY, Disp: 30 tablet, Rfl: 2  Assessment/ Plan: 28 y.o. male   1. Depression, major, single episode, mild (HCC) Try the medication with a full meal If nausea and vomiting continues please call us back and we will change medication to Prozac. - escitalopram (LEXAPRO) 10 MG tablet; Take 1 tablet (10 mg total) by mouth daily.  Dispense: 30 tablet; Refill: 5  2. GAD (generalized anxiety disorder) - escitalopram (LEXAPRO) 10 MG tablet;  Take 1 tablet (10 mg total) by mouth daily.  Dispense: 30 tablet; Refill: 5   Start time: 9:30 AM End time: 9:36 AM  Meds ordered this encounter  Medications  . escitalopram (LEXAPRO) 10 MG tablet    Sig: Take 1 tablet (10 mg total) by mouth daily.    Dispense:  30 tablet    Refill:  5    Order Specific Question:   Supervising Provider    Answer:   Raliegh Ip [[id number]]    Prudy Feeler PA-C [facility] [phone]

## 2018-07-22 ENCOUNTER — Other Ambulatory Visit: Payer: Self-pay | Admitting: Physician Assistant

## 2018-07-22 DIAGNOSIS — E039 Hypothyroidism, unspecified: Secondary | ICD-10-CM

## 2018-08-15 ENCOUNTER — Ambulatory Visit (INDEPENDENT_AMBULATORY_CARE_PROVIDER_SITE_OTHER): Payer: 59 | Admitting: Physician Assistant

## 2018-08-15 ENCOUNTER — Other Ambulatory Visit: Payer: Self-pay

## 2018-08-15 DIAGNOSIS — F411 Generalized anxiety disorder: Secondary | ICD-10-CM

## 2018-08-15 DIAGNOSIS — F320 Major depressive disorder, single episode, mild: Secondary | ICD-10-CM | POA: Diagnosis not present

## 2018-08-15 MED ORDER — DULOXETINE HCL 30 MG PO CPEP: 30.0000 mg | ORAL_CAPSULE | Freq: Every day | ORAL | 3 refills | Status: DC

## 2018-08-16 ENCOUNTER — Encounter: Payer: Self-pay | Admitting: Physician Assistant

## 2018-08-16 NOTE — Progress Notes (Signed)
      Telephone visit  Subjective: CC: Anxiety PCP: Remus Loffler, PA-C Jordan Barnett is a 28 y.o. male calls for telephone consult today. Patient provides verbal consent for consult held via phone.  Patient is identified with 2 separate identifiers.  At this time the entire area is on COVID-19 social distancing and stay home orders are in place.  Patient is of higher risk and therefore we are performing this by a virtual method.  Location of patient: Home Location of provider: HOME Others present for call: No  The patient states that he is still feeling quite anxious over medication.  His treatment with the previous medication has really not helped the depression like he expected.  He is not using much alprazolam at all.  But he does have it if he needs it.  I think we will change him to an SNRI at this time.  He is willing to try the medication and we will taper him off of his Lexapro.  He will start with Cymbalta with just 30 mg a day and we will try to build up slowly.  ROS: Per HPI  Allergies  Allergen Reactions  . Fenofibrate Micronized     Sexual dysfunc   Past Medical History:  Diagnosis Date  . Sciatica   . Thyroid disease     Current Outpatient Medications:  .  ALPRAZolam (XANAX) 0.25 MG tablet, Take 1 tablet (0.25 mg total) by mouth 2 (two) times daily as needed for anxiety., Disp: 20 tablet, Rfl: 0 .  DULoxetine (CYMBALTA) 30 MG capsule, Take 1 capsule (30 mg total) by mouth daily., Disp: 60 capsule, Rfl: 3 .  levothyroxine (SYNTHROID) 100 MCG tablet, TAKE ONE (1) TABLET EACH DAY, Disp: 30 tablet, Rfl: 11  Assessment/ Plan: 28 y.o. male   1. Depression, major, single episode, mild (HCC) Duloxetine 30 mg 1 daily  2. GAD (generalized anxiety disorder) Duloxetine 30 mg 1 daily   Start time: 9:38 AM End time: 9:49 AM  Meds ordered this encounter  Medications  . DULoxetine (CYMBALTA) 30 MG capsule    Sig: Take 1 capsule (30 mg total) by mouth daily.     Dispense:  60 capsule    Refill:  3    Order Specific Question:   Supervising Provider    Answer:   Raliegh Ip [[id number]]    Prudy Feeler PA-C [facility] [phone]

## 2018-09-13 ENCOUNTER — Encounter: Payer: Self-pay | Admitting: Physician Assistant

## 2018-09-13 ENCOUNTER — Other Ambulatory Visit: Payer: Self-pay

## 2018-09-13 ENCOUNTER — Telehealth: Payer: Self-pay | Admitting: Physician Assistant

## 2018-09-13 ENCOUNTER — Ambulatory Visit (INDEPENDENT_AMBULATORY_CARE_PROVIDER_SITE_OTHER): Payer: 59 | Admitting: Physician Assistant

## 2018-09-13 DIAGNOSIS — R112 Nausea with vomiting, unspecified: Secondary | ICD-10-CM

## 2018-09-13 DIAGNOSIS — R0789 Other chest pain: Secondary | ICD-10-CM | POA: Diagnosis not present

## 2018-09-13 NOTE — Telephone Encounter (Signed)
Patient requesting follow up call.

## 2018-09-13 NOTE — Telephone Encounter (Signed)
Since 1:52 PM I have tried 3 times and just get VM.

## 2018-09-13 NOTE — Progress Notes (Signed)
        Telephone visit  Subjective: CC: Vomiting, chest pain PCP: Terald Sleeper, PA-C TWS:[id number] Reffner is a 28 y.o. male calls for telephone consult today. Patient provides verbal consent for consult held via phone.  Patient is identified with 2 separate identifiers.  At this time the entire area is on COVID-19 social distancing and stay home orders are in place.  Patient is of higher risk and therefore we are performing this by a virtual method.  Location of patient: Home Location of provider: [facility] Others present for call: None   One evening before he went into work, he had an episode of vomiting.  He has not had any more since then.  And he denies any nausea.  He did go out to eat at a restaurant over the weekend.  No one else around him is sick.  He let his nurse at work know that he had been sick and she thought that he may need to get tested for [place].  However no one is sick at his work with [place].   The other symptom that he has had more persistently is sharp pain in chest, comes and goes through the day.  Can happen at random times.  It can happen even when he is laying down at night.  Pain is like a sharp pain slightly right of the sternum.  Hurts when he takes a deep breath.  Lasts 5-20 minutes. Never feels reflux come up.  No cough at night. Happens daily,  No tachycardia Denies fever and chills Has never had a history of cardiac condition. When elicited, I asked him to push on the lateral side of his chest wall just right of the sternum.  He did have some tenderness when he pushed on it.   Instructed to use Ibuprofen 200 mg BID 1 week, then go down to once daily for a week.  He is to call back if there are any worsening symptoms.   ROS: Per HPI  Allergies  Allergen Reactions  . Fenofibrate Micronized     Sexual dysfunc   Past Medical History:  Diagnosis Date  . Sciatica   . Thyroid disease     Current Outpatient Medications:  .  ALPRAZolam (XANAX)  0.25 MG tablet, Take 1 tablet (0.25 mg total) by mouth 2 (two) times daily as needed for anxiety., Disp: 20 tablet, Rfl: 0 .  DULoxetine (CYMBALTA) 30 MG capsule, Take 1 capsule (30 mg total) by mouth daily., Disp: 60 capsule, Rfl: 3 .  levothyroxine (SYNTHROID) 100 MCG tablet, TAKE ONE (1) TABLET EACH DAY, Disp: 30 tablet, Rfl: 11  Assessment/ Plan: 28 y.o. male   1. Anterior chest wall pain Ibuprofen 200 mg 2 tablets twice daily for 1 week then lowered to once daily for 1 week  2. Non-intractable vomiting with nausea, unspecified vomiting type Resolved   Continue all other maintenance medications as listed above.  Start time: 9 AM End time: 9:14 AM  No orders of the defined types were placed in this encounter.   Particia Nearing PA-C [facility] [phone]

## 2018-12-08 ENCOUNTER — Ambulatory Visit (INDEPENDENT_AMBULATORY_CARE_PROVIDER_SITE_OTHER): Payer: 59 | Admitting: Nurse Practitioner

## 2018-12-08 ENCOUNTER — Other Ambulatory Visit: Payer: Self-pay

## 2018-12-08 ENCOUNTER — Encounter: Payer: Self-pay | Admitting: Nurse Practitioner

## 2018-12-08 DIAGNOSIS — J020 Streptococcal pharyngitis: Secondary | ICD-10-CM

## 2018-12-08 MED ORDER — AMOXICILLIN 875 MG PO TABS: 875.0000 mg | ORAL_TABLET | Freq: Two times a day (BID) | ORAL | 0 refills | Status: DC

## 2018-12-08 NOTE — Progress Notes (Signed)
   Virtual Visit via telephone Note Due to COVID-19 pandemic this visit was conducted virtually. This visit type was conducted due to national recommendations for restrictions regarding the COVID-19 Pandemic (e.g. social distancing, sheltering in place) in an effort to limit this patient's exposure and mitigate transmission in our community. All issues noted in this document were discussed and addressed.  A physical exam was not performed with this format.  I connected with Jordan Barnett on 12/08/18 at 4:50 by telephone and verified that I am speaking with the correct person using two ntifiers. Jordan Barnett is currently located at home and his wife is currently with him during visit. The provider, Mary-Margaret Hassell Done, FNP is located in their office at time of visit.  I discussed the limitations, risks, security and privacy concerns of performing an evaluation and management service by telephone and the availability of in person appointments. I also discussed with the patient that there may be a patient responsible charge related to this service. The patient expressed understanding and agreed to proceed.   History and Present Illness:    Chief Complaint: Cough   HPI Patient calls in c/o sore throat that started last night. His wife has strep and is on amoxicillin.  Review of Systems  Constitutional: Negative for chills and fever.  HENT: Positive for congestion and sore throat. Negative for sinus pain.   Respiratory: Positive for cough (slight).   Neurological: Positive for dizziness and headaches.  Psychiatric/Behavioral: Negative.      Observations/Objective: Alert and oriented- answers all questions appropriately No distress    Assessment and Plan: .Jordan Barnett in today with chief complaint of Cough   1. Strep pharyngitis  Meds ordered this encounter  Medications  . DISCONTD: amoxicillin (AMOXIL) 875 MG tablet    Sig: Take 1 tablet (875 mg total) by mouth 2 (two)  times daily. 1 po BID    Dispense:  20 tablet    Refill:  0    Order Specific Question:   Supervising Provider    Answer:   Caryl Pina A [id number]  . amoxicillin (AMOXIL) 875 MG tablet    Sig: Take 1 tablet (875 mg total) by mouth 2 (two) times daily. 1 po BID    Dispense:  20 tablet    Refill:  0    Order Specific Question:   Supervising Provider    Answer:   Worthy Rancher [id number]   Force fluids Motrin or tylenol OTC OTC decongestant Throat lozenges if help New toothbrush in 3 days  Follow up prn   I discussed the assessment and treatment plan with the patient. The patient was provided an opportunity to ask questions and all were answered. The patient agreed with the plan and demonstrated an understanding of the instructions.   The patient was advised to call back or seek an in-person evaluation if the symptoms worsen or if the condition fails to improve as anticipated.  The above assessment and management plan was discussed with the patient. The patient verbalized understanding of and has agreed to the management plan. Patient is aware to call the clinic if symptoms persist or worsen. Patient is aware when to return to the clinic for a follow-up visit. Patient educated on when it is appropriate to go to the emergency department.   Time call ended:  5:02  I provided 12 minutes of non-face-to-face time during this encounter.    Mary-Margaret Hassell Done, FNP

## 2019-03-15 ENCOUNTER — Ambulatory Visit (INDEPENDENT_AMBULATORY_CARE_PROVIDER_SITE_OTHER): Payer: 59 | Admitting: Family Medicine

## 2019-03-15 ENCOUNTER — Other Ambulatory Visit: Payer: Self-pay

## 2019-03-15 ENCOUNTER — Encounter: Payer: Self-pay | Admitting: Family Medicine

## 2019-03-15 DIAGNOSIS — J069 Acute upper respiratory infection, unspecified: Secondary | ICD-10-CM | POA: Diagnosis not present

## 2019-03-15 NOTE — Progress Notes (Signed)
Virtual Visit via telephone Note Due to COVID-19 pandemic this visit was conducted virtually. This visit type was conducted due to national recommendations for restrictions regarding the COVID-19 Pandemic (e.g. social distancing, sheltering in place) in an effort to limit this patient's exposure and mitigate transmission in our community. All issues noted in this document were discussed and addressed.  A physical exam was not performed with this format.   I connected with Jordan Barnett on 03/15/2019 at 1340 by telephone and verified that I am speaking with the correct person using two identifiers. Jordan Barnett is currently located at home and family is currently with them during visit. The provider, Kari BaarsMichelle Mykaela Arena, FNP is located in their office at time of visit.  I discussed the limitations, risks, security and privacy concerns of performing an evaluation and management service by telephone and the availability of in person appointments. I also discussed with the patient that there may be a patient responsible charge related to this service. The patient expressed understanding and agreed to proceed.  Subjective:  Patient ID: Jordan Barnett, male    DOB: Jul 29, 1990, 28 y.o.   MRN: [id number]  Chief Complaint:  URI   HPI: Jordan Barnett is a 28 y.o. male presenting on 03/15/2019 for URI   Pt with URI symptoms. Work sent him home and wanted him tested for COVID-19. Pt has been around large groups over the holidays but denies exposure to anyone sick.   URI  This is a new problem. The current episode started in the past 7 days. The problem has been waxing and waning. The maximum temperature recorded prior to his arrival was 100.4 - 100.9 F. Associated symptoms include congestion, coughing, headaches, rhinorrhea, sinus pain and a sore throat. Pertinent negatives include no abdominal pain, chest pain, diarrhea, dysuria, ear pain, joint pain, joint swelling, nausea, neck pain, plugged ear  sensation, rash, sneezing, swollen glands, vomiting or wheezing. He has tried nothing for the symptoms. The treatment provided no relief.     Relevant past medical, surgical, family, and social history reviewed and updated as indicated.  Allergies and medications reviewed and updated.   Past Medical History:  Diagnosis Date  . Sciatica   . Thyroid disease     Past Surgical History:  Procedure Laterality Date  . LAPAROSCOPIC APPENDECTOMY N/A 12/07/2012   Procedure: APPENDECTOMY LAPAROSCOPIC;  Surgeon: Atilano InaEric M Wilson, MD;  [facility];  Service: General;  Laterality: N/A;    Social History   Socioeconomic History  . Marital status: Married    Spouse name: Not on file  . Number of children: Not on file  . Years of education: Not on file  . Highest education level: Not on file  Occupational History  . Not on file  Tobacco Use  . Smoking status: Former Games developermoker  . Smokeless tobacco: Former Engineer, waterUser  Substance and Sexual Activity  . Alcohol use: No  . Drug use: No  . Sexual activity: Yes  Other Topics Concern  . Not on file  Social History Narrative  . Not on file   Social Determinants of Health   Financial Resource Strain:   . Difficulty of Paying Living Expenses: Not on file  Food Insecurity:   . Worried About Programme researcher, broadcasting/film/videounning Out of Food in the Last Year: Not on file  . Ran Out of Food in the Last Year: Not on file  Transportation Needs:   . Lack of Transportation (Medical): Not on file  . Lack of Transportation (Non-Medical): Not on  file  Physical Activity:   . Days of Exercise per Week: Not on file  . Minutes of Exercise per Session: Not on file  Stress:   . Feeling of Stress : Not on file  Social Connections:   . Frequency of Communication with Friends and Family: Not on file  . Frequency of Social Gatherings with Friends and Family: Not on file  . Attends Religious Services: Not on file  . Active Member of Clubs or Organizations: Not on file  . Attends Tax inspector Meetings: Not on file  . Marital Status: Not on file  Intimate Partner Violence:   . Fear of Current or Ex-Partner: Not on file  . Emotionally Abused: Not on file  . Physically Abused: Not on file  . Sexually Abused: Not on file    Outpatient Encounter Medications as of 03/15/2019  Medication Sig  . ALPRAZolam (XANAX) 0.25 MG tablet Take 1 tablet (0.25 mg total) by mouth 2 (two) times daily as needed for anxiety.  Marland Kitchen amoxicillin (AMOXIL) 875 MG tablet Take 1 tablet (875 mg total) by mouth 2 (two) times daily. 1 po BID  . DULoxetine (CYMBALTA) 30 MG capsule Take 1 capsule (30 mg total) by mouth daily.  Marland Kitchen levothyroxine (SYNTHROID) 100 MCG tablet TAKE ONE (1) TABLET EACH DAY   No facility-administered encounter medications on file as of 03/15/2019.    Allergies  Allergen Reactions  . Fenofibrate Micronized     Sexual dysfunc    Review of Systems  Constitutional: Negative for activity change, appetite change, chills, diaphoresis, fatigue, fever and unexpected weight change.  HENT: Positive for congestion, postnasal drip, rhinorrhea, sinus pressure, sinus pain and sore throat. Negative for ear pain and sneezing.   Eyes: Negative.   Respiratory: Positive for cough. Negative for chest tightness, shortness of breath and wheezing.   Cardiovascular: Negative for chest pain, palpitations and leg swelling.  Gastrointestinal: Negative for abdominal pain, blood in stool, constipation, diarrhea, nausea and vomiting.  Endocrine: Negative.   Genitourinary: Negative for decreased urine volume, difficulty urinating, dysuria, frequency and urgency.  Musculoskeletal: Negative for arthralgias, joint pain, myalgias and neck pain.  Skin: Negative.  Negative for rash.  Allergic/Immunologic: Negative.   Neurological: Positive for headaches. Negative for dizziness, tremors, seizures, syncope, facial asymmetry, speech difficulty, weakness, light-headedness and numbness.  Hematological:  Negative.   Psychiatric/Behavioral: Negative for confusion, hallucinations, sleep disturbance and suicidal ideas.  All other systems reviewed and are negative.        Observations/Objective: No vital signs or physical exam, this was a telephone or virtual health encounter.  Pt alert and oriented, answers all questions appropriately, and able to speak in full sentences.    Assessment and Plan: Jordan Barnett was seen today for uri.  Diagnoses and all orders for this visit:  URI with cough and congestion Symptoms concerning for COVID-19 viral infection. Pt will have testing completed at place of employment. Symptomatic care discussed in detail. Report any new, worsening, or persistent symptoms. Follow up as needed.     Follow Up Instructions: Return if symptoms worsen or fail to improve.    I discussed the assessment and treatment plan with the patient. The patient was provided an opportunity to ask questions and all were answered. The patient agreed with the plan and demonstrated an understanding of the instructions.   The patient was advised to call back or seek an in-person evaluation if the symptoms worsen or if the condition fails to improve as anticipated.  The  above assessment and management plan was discussed with the patient. The patient verbalized understanding of and has agreed to the management plan. Patient is aware to call the clinic if they develop any new symptoms or if symptoms persist or worsen. Patient is aware when to return to the clinic for a follow-up visit. Patient educated on when it is appropriate to go to the emergency department.    I provided 15 minutes of non-face-to-face time during this encounter. The call started at 1340. The call ended at 1355. The other time was used for coordination of care.    Monia Pouch, FNP-C [facility] [address] [place], [place] [address] [phone] 03/15/2019

## 2019-06-13 ENCOUNTER — Telehealth: Payer: Self-pay | Admitting: Physician Assistant

## 2019-06-13 NOTE — Telephone Encounter (Signed)
Pt states he was seen at Urgent Care for chest pain and they did an EKG and didn't see anything so they told him to follow up with PCP. Pt states he has had this problem in the past and it feels the same, appt scheduled with Kari Baars 06/23/19 at 10:35. Advised pt if he starts to have Chest pain, dizziness, lightheadedness, feels like his heart is racing, weakness prior to appt to go to ED or call back. Pt voiced understanding.

## 2019-06-14 ENCOUNTER — Encounter: Payer: Self-pay | Admitting: Family Medicine

## 2019-06-14 ENCOUNTER — Telehealth (INDEPENDENT_AMBULATORY_CARE_PROVIDER_SITE_OTHER): Payer: 59 | Admitting: Family Medicine

## 2019-06-14 DIAGNOSIS — R0789 Other chest pain: Secondary | ICD-10-CM

## 2019-06-14 NOTE — Progress Notes (Signed)
Virtual Visit via telephone Note  I connected with Jordan Barnett on 06/14/19 at 1540 by telephone and verified that I am speaking with the correct person using two identifiers. Jordan Barnett is currently located at home and no other people are currently with her during visit. The provider, Fransisca Kaufmann Jaimes Eckert, MD is located in their office at time of visit.  Call ended at 1556  I discussed the limitations, risks, security and privacy concerns of performing an evaluation and management service by telephone and the availability of in person appointments. I also discussed with the patient that there may be a patient responsible charge related to this service. The patient expressed understanding and agreed to proceed.   History and Present Illness: Patient is calling in for chest pains that come and go.  The pain was worse this last time.  The pain came out of nowhere and he started have pain in the middle and over to the right and it happened this time while driving and then again at bedtime. He was having so much pain that he was having trouble breathing. The pain is just to the right of the sternum.  He has had this at work previously. He has been off of work until today from urgent care.  His father had congestive heart.  He denies shortness of breath now.  He has a mild amount of chest tightness. He had not slept well before this last time.  He has been off work since.  Usual it happens with activity. This happened 2 days ago. 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  No real hard labor.   No diagnosis found.  Outpatient Encounter Medications as of 06/14/2019  Medication Sig  . ALPRAZolam (XANAX) 0.25 MG tablet Take 1 tablet (0.25 mg total) by mouth 2 (two) times daily as needed for anxiety.  Marland Kitchen amoxicillin (AMOXIL) 875 MG tablet Take 1 tablet (875 mg total) by mouth 2 (two) times daily. 1 po BID  . DULoxetine (CYMBALTA) 30 MG capsule Take 1 capsule (30 mg total) by mouth  daily.  Marland Kitchen levothyroxine (SYNTHROID) 100 MCG tablet TAKE ONE (1) TABLET EACH DAY   No facility-administered encounter medications on file as of 06/14/2019.    Review of Systems  Constitutional: Negative for chills and fever.  Eyes: Negative for visual disturbance.  Respiratory: Negative for shortness of breath and wheezing.   Cardiovascular: Positive for chest pain. Negative for leg swelling.  Musculoskeletal: Negative for back pain and gait problem.  Skin: Negative for rash.  Neurological: Negative for dizziness, weakness and numbness.  All other systems reviewed and are negative.   Observations/Objective: Patient sounds comfortable and in no acute distress  Assessment and Plan: Problem List Items Addressed This Visit    None    Visit Diagnoses    Atypical chest pain    -  Primary   Relevant Orders   Ambulatory referral to Cardiology       Follow up plan: Return if symptoms worsen or fail to improve.     I discussed the assessment and treatment plan with the patient. The patient was provided an opportunity to ask questions and all were answered. The patient agreed with the plan and demonstrated an understanding of the instructions.   The patient was advised to call back or seek an in-person evaluation if the symptoms worsen or if the condition fails to improve as anticipated.  The above assessment and management plan was discussed with  the patient. The patient verbalized understanding of and has agreed to the management plan. Patient is aware to call the clinic if symptoms persist or worsen. Patient is aware when to return to the clinic for a follow-up visit. Patient educated on when it is appropriate to go to the emergency department.    I provided 16 minutes of non-face-to-face time during this encounter.    Nils Pyle, MD

## 2019-06-16 ENCOUNTER — Ambulatory Visit: Payer: 59 | Attending: Internal Medicine

## 2019-06-16 DIAGNOSIS — Z23 Encounter for immunization: Secondary | ICD-10-CM

## 2019-06-16 NOTE — Progress Notes (Signed)
   Covid-19 Vaccination Clinic  Name:  Jordan Barnett    MRN: [id number] DOB: May 25, 1990  06/16/2019  Jordan Barnett was observed post Covid-19 immunization for 15 minutes without incident. He was provided with Vaccine Information Sheet and instruction to access the V-Safe system.   Jordan Barnett was instructed to call 911 with any severe reactions post vaccine: Marland Kitchen Difficulty breathing  . Swelling of face and throat  . A fast heartbeat  . A bad rash all over body  . Dizziness and weakness   Immunizations Administered    Name Date Dose VIS Date Route   Pfizer COVID-19 Vaccine 06/16/2019  8:46 AM 0.3 mL 02/24/2019 Intramuscular   Manufacturer: [employer], [employer]   Lot: [id number]   NDC: [address]-4

## 2019-06-18 NOTE — Progress Notes (Signed)
Cardiology Office Note:    Date:  06/19/2019   ID:  Jordan Barnett, DOB Apr 21, 1990, MRN [id number]  PCP:  Terald Sleeper, PA-C  Cardiologist:  Donato Heinz, MD  Electrophysiologist:  None   Referring MD: Dettinger, Fransisca Kaufmann, MD   Chief Complaint  Patient presents with  . Chest Pain    History of Present Illness:    Jordan Barnett is a 29 y.o. male with a hx of hypothyroidism who is referred by Dr. Warrick Parisian for evaluation of chest pain.  Reports that chest pain has been occurring for the last year.  Will last for hours when it occurs.  May not occur for weeks or a month between episodes.  Occurs on the right side of the chest, describes a squeezing pain.  Worse with deep breaths or lying down.  Has not noted a relationship with exertion.  He goes to the gym intermittently, does jogging /walking treadmill for about 30 minutes No chest pain with this.  Denies any lightheadedness, syncope, or palpitations.  He smoked 1 to 1.5 packs/day x 9 years, quit 6 years ago.  Father died of congestive heart failure in his 30s (thinks he had MI).   Past Medical History:  Diagnosis Date  . Sciatica   . Thyroid disease     Past Surgical History:  Procedure Laterality Date  . LAPAROSCOPIC APPENDECTOMY N/A 12/07/2012   Procedure: APPENDECTOMY LAPAROSCOPIC;  Surgeon: Gayland Curry, MD;  Location: [facility] OR;  Service: General;  Laterality: N/A;    Current Medications: Current Meds  Medication Sig  . levothyroxine (SYNTHROID) 100 MCG tablet TAKE ONE (1) TABLET EACH DAY  . [DISCONTINUED] ALPRAZolam (XANAX) 0.25 MG tablet Take 1 tablet (0.25 mg total) by mouth 2 (two) times daily as needed for anxiety.  . [DISCONTINUED] DULoxetine (CYMBALTA) 30 MG capsule Take 1 capsule (30 mg total) by mouth daily.     Allergies:   Fenofibrate micronized   Social History   Socioeconomic History  . Marital status: Married    Spouse name: Not on file  . Number of children: Not on file  . Years of  education: Not on file  . Highest education level: Not on file  Occupational History  . Not on file  Tobacco Use  . Smoking status: Former Research scientist (life sciences)  . Smokeless tobacco: Former Network engineer and Sexual Activity  . Alcohol use: No  . Drug use: No  . Sexual activity: Yes  Other Topics Concern  . Not on file  Social History Narrative  . Not on file   Social Determinants of Health   Financial Resource Strain:   . Difficulty of Paying Living Expenses:   Food Insecurity:   . Worried About Charity fundraiser in the Last Year:   . Arboriculturist in the Last Year:   Transportation Needs:   . Film/video editor (Medical):   Marland Kitchen Lack of Transportation (Non-Medical):   Physical Activity:   . Days of Exercise per Week:   . Minutes of Exercise per Session:   Stress:   . Feeling of Stress :   Social Connections:   . Frequency of Communication with Friends and Family:   . Frequency of Social Gatherings with Friends and Family:   . Attends Religious Services:   . Active Member of Clubs or Organizations:   . Attends Archivist Meetings:   Marland Kitchen Marital Status:      Family History: The patient's family history includes Diabetes  in his father; Hypertension in his mother.  ROS:   Please see the history of present illness.     All other systems reviewed and are negative.  EKGs/Labs/Other Studies Reviewed:    The following studies were reviewed today:   EKG:  EKG is  ordered today.  The ekg ordered today demonstrates normal sinus rhythm with sinus arrhythmia, rate 81, nonspecific T wave flattening  Recent Labs: 07/19/2018: ALT 37; BUN 14; Creatinine, Ser 1.02; Potassium 4.1; Sodium 139; TSH 3.000  Recent Lipid Panel    Component Value Date/Time   CHOL 183 07/01/2017 0939   TRIG 356 (H) 07/01/2017 0939   HDL 26 (L) 07/01/2017 0939   CHOLHDL 7.0 (H) 07/01/2017 0939   LDLCALC 86 07/01/2017 0939    Physical Exam:    VS:  BP 130/90   Pulse 81   Ht '5\' 10"'  (1.778 m)    Wt 250 lb (113.4 kg)   SpO2 100%   BMI 35.87 kg/m     Wt Readings from Last 3 Encounters:  06/19/19 250 lb (113.4 kg)  07/19/18 245 lb 9.6 oz (111.4 kg)  03/30/18 238 lb (108 kg)     GEN:  in no acute distress HEENT: Normal NECK: No JVD; No carotid bruits LYMPHATICS: No lymphadenopathy CARDIAC: RRR, no murmurs, rubs, gallops RESPIRATORY:  Clear to auscultation without rales, wheezing or rhonchi  ABDOMEN: Soft, non-tender, non-distended MUSCULOSKELETAL:  No edema; No deformity  SKIN: Warm and dry NEUROLOGIC:  Alert and oriented x 3 PSYCHIATRIC:  Normal affect   ASSESSMENT:    1. Chest pain of uncertain etiology    PLAN:     Chest pain: Atypical in description.  Does have CAD risk factors (smoking, significant family history) but given age, is low risk for obstructive coronary disease.  EKG is interpretable and he is able to exercise, will evaluate with ETT.  Pericarditis is also on the differential, as describes pleuritic/positional component to the pain.  Will check ESR/CRP and TTE.  RTC in 2 months  Medication Adjustments/Labs and Tests Ordered: Current medicines are reviewed at length with the patient today.  Concerns regarding medicines are outlined above.  Orders Placed This Encounter  Procedures  . C-reactive protein  . Sedimentation rate  . EXERCISE TOLERANCE TEST (ETT)  . EKG 12-Lead  . ECHOCARDIOGRAM COMPLETE   No orders of the defined types were placed in this encounter.   Patient Instructions  Medication Instructions:  Your physician recommends that you continue on your current medications as directed. Please refer to the Current Medication list given to you today.  Lab Work: Today (sed rate, CRP) If you have labs (blood work) drawn today and your tests are completely normal, you will receive your results only by: Marland Kitchen MyChart Message (if you have MyChart) OR . A paper copy in the mail If you have any lab test that is abnormal or we need to change your  treatment, we will call you to review the results.   Testing/Procedures: Your physician has requested that you have an exercise tolerance test. For further information please visit [email or web address]. Please also follow instruction sheet, as given. --you will need a covid test 3 days prior  Your physician has requested that you have an echocardiogram. Echocardiography is a painless test that uses sound waves to create images of your heart. It provides your doctor with information about the size and shape of your heart and how well your heart's chambers and valves are working. This procedure takes approximately  one hour. There are no restrictions for this procedure. This will be done at our [facility] location:  [place]: At [employer], you and your health needs are our priority.  As part of our continuing mission to provide you with exceptional heart care, we have created designated Provider Care Teams.  These Care Teams include your primary Cardiologist (physician) and Advanced Practice Providers (APPs -  Physician Assistants and Nurse Practitioners) who all work together to provide you with the care you need, when you need it.  We recommend signing up for the patient portal called "MyChart".  Sign up information is provided on this After Visit Summary.  MyChart is used to connect with patients for Virtual Visits (Telemedicine).  Patients are able to view lab/test results, encounter notes, upcoming appointments, etc.  Non-urgent messages can be sent to your provider as well.   To learn more about what you can do with MyChart, go to [email or web address].    Your next appointment:   2 month(s)  The format for your next appointment:   In Person  Provider:   Oswaldo Milian, MD       Signed, Donato Heinz, MD  06/19/2019 12:38 PM    [place]

## 2019-06-19 ENCOUNTER — Encounter: Payer: Self-pay | Admitting: Cardiology

## 2019-06-19 ENCOUNTER — Ambulatory Visit: Payer: 59 | Admitting: Cardiology

## 2019-06-19 ENCOUNTER — Other Ambulatory Visit: Payer: Self-pay

## 2019-06-19 VITALS — BP 130/90 | HR 81 | Ht 70.0 in | Wt 250.0 lb

## 2019-06-19 DIAGNOSIS — R079 Chest pain, unspecified: Secondary | ICD-10-CM

## 2019-06-19 LAB — C-REACTIVE PROTEIN: CRP: 3 mg/L (ref 0–10)

## 2019-06-19 LAB — SEDIMENTATION RATE: Sed Rate: 23 mm/hr — ABNORMAL HIGH (ref 0–15)

## 2019-06-19 NOTE — Patient Instructions (Signed)
Medication Instructions:  Your physician recommends that you continue on your current medications as directed. Please refer to the Current Medication list given to you today.  Lab Work: Today (sed rate, CRP) If you have labs (blood work) drawn today and your tests are completely normal, you will receive your results only by: Marland Kitchen MyChart Message (if you have MyChart) OR . A paper copy in the mail If you have any lab test that is abnormal or we need to change your treatment, we will call you to review the results.   Testing/Procedures: Your physician has requested that you have an exercise tolerance test. For further information please visit [email or web address] Please also follow instruction sheet, as given. --you will need a covid test 3 days prior  Your physician has requested that you have an echocardiogram. Echocardiography is a painless test that uses sound waves to create images of your heart. It provides your doctor with information about the size and shape of your heart and how well your heart's chambers and valves are working. This procedure takes approximately one hour. There are no restrictions for this procedure. This will be done at our [facility] location:  [employer] [address]  Follow-Up: At [employer], you and your health needs are our priority.  As part of our continuing mission to provide you with exceptional heart care, we have created designated Provider Care Teams.  These Care Teams include your primary Cardiologist (physician) and Advanced Practice Providers (APPs -  Physician Assistants and Nurse Practitioners) who all work together to provide you with the care you need, when you need it.  We recommend signing up for the patient portal called "MyChart".  Sign up information is provided on this After Visit Summary.  MyChart is used to connect with patients for Virtual Visits (Telemedicine).  Patients are able to view lab/test results, encounter notes, upcoming  appointments, etc.  Non-urgent messages can be sent to your provider as well.   To learn more about what you can do with MyChart, go to [email or web address].    Your next appointment:   2 month(s)  The format for your next appointment:   In Person  Provider:   Epifanio Lesches, MD

## 2019-06-21 ENCOUNTER — Telehealth: Payer: Self-pay | Admitting: Physician Assistant

## 2019-06-21 NOTE — Telephone Encounter (Signed)
LMOVM form not received, alternate fax number given [phone]

## 2019-06-22 ENCOUNTER — Telehealth (HOSPITAL_COMMUNITY): Payer: Self-pay | Admitting: *Deleted

## 2019-06-22 NOTE — Telephone Encounter (Signed)
Close encounter 

## 2019-06-23 ENCOUNTER — Ambulatory Visit: Payer: 59 | Admitting: Family Medicine

## 2019-06-23 ENCOUNTER — Other Ambulatory Visit (HOSPITAL_COMMUNITY)
Admission: RE | Admit: 2019-06-23 | Discharge: 2019-06-23 | Disposition: A | Discharge status: Home / Self Care | Payer: 59 | Source: Ambulatory Visit | Attending: Cardiology | Admitting: Cardiology

## 2019-06-23 DIAGNOSIS — Z01812 Encounter for preprocedural laboratory examination: Secondary | ICD-10-CM | POA: Diagnosis present

## 2019-06-23 DIAGNOSIS — Z20822 Contact with and (suspected) exposure to covid-19: Secondary | ICD-10-CM | POA: Insufficient documentation

## 2019-06-23 LAB — SARS CORONAVIRUS 2 (TAT 6-24 HRS): SARS Coronavirus 2: NEGATIVE

## 2019-06-23 NOTE — Progress Notes (Signed)
Pt sts he tested + for covid in the middle of Jan, but is unable to produce the results. Pt made aware that due to this finding, he needs to be tested and if the result is + again, it will be up to the ordering physician if the procedure will take place. Pt was asymptomatic during the time of testing.

## 2019-06-27 ENCOUNTER — Ambulatory Visit (HOSPITAL_COMMUNITY)
Admission: RE | Admit: 2019-06-27 | Discharge: 2019-06-27 | Disposition: A | Discharge status: Home / Self Care | Payer: 59 | Source: Ambulatory Visit | Attending: Cardiovascular Disease | Admitting: Cardiovascular Disease

## 2019-06-27 ENCOUNTER — Other Ambulatory Visit: Payer: Self-pay

## 2019-06-27 DIAGNOSIS — R079 Chest pain, unspecified: Secondary | ICD-10-CM | POA: Diagnosis present

## 2019-06-27 LAB — EXERCISE TOLERANCE TEST
Estimated workload: 10.4 METS
Exercise duration (min): 9 min
Exercise duration (sec): 0 s
MPHR: 192 {beats}/min
Peak HR: 187 {beats}/min
Percent HR: 97 %
Rest HR: 108 {beats}/min

## 2019-07-05 ENCOUNTER — Ambulatory Visit (HOSPITAL_COMMUNITY): Payer: 59 | Attending: Cardiology

## 2019-07-05 ENCOUNTER — Other Ambulatory Visit: Payer: Self-pay

## 2019-07-05 DIAGNOSIS — R079 Chest pain, unspecified: Secondary | ICD-10-CM | POA: Insufficient documentation

## 2019-07-11 ENCOUNTER — Ambulatory Visit: Payer: Self-pay

## 2019-07-12 ENCOUNTER — Ambulatory Visit: Payer: 59 | Attending: Internal Medicine

## 2019-07-12 DIAGNOSIS — Z23 Encounter for immunization: Secondary | ICD-10-CM

## 2019-07-12 NOTE — Progress Notes (Signed)
   Covid-19 Vaccination Clinic  Name:  Jordan Barnett    MRN: [id number] DOB: 1990-12-16  07/12/2019  [name] was observed post Covid-19 immunization for 15 minutes without incident. He was provided with Vaccine Information Sheet and instruction to access the V-Safe system.   [name] was instructed to call 911 with any severe reactions post vaccine: Marland Kitchen Difficulty breathing  . Swelling of face and throat  . A fast heartbeat  . A bad rash all over body  . Dizziness and weakness   Immunizations Administered    Name Date Dose VIS Date Route   Pfizer COVID-19 Vaccine 07/12/2019  4:42 PM 0.3 mL 05/10/2018 Intramuscular   Manufacturer: [employer], [employer]   Lot: [id number]   NDC: [address]-0

## 2019-07-17 ENCOUNTER — Ambulatory Visit: Payer: Self-pay

## 2019-08-23 ENCOUNTER — Ambulatory Visit: Payer: 59 | Admitting: Cardiology

## 2019-08-30 ENCOUNTER — Other Ambulatory Visit: Payer: Self-pay | Admitting: *Deleted

## 2019-08-30 DIAGNOSIS — E039 Hypothyroidism, unspecified: Secondary | ICD-10-CM

## 2019-08-30 MED ORDER — LEVOTHYROXINE SODIUM 100 MCG PO TABS: ORAL_TABLET | ORAL | 0 refills | Status: DC

## 2019-09-07 ENCOUNTER — Other Ambulatory Visit: Payer: Self-pay | Admitting: Family Medicine

## 2019-09-07 DIAGNOSIS — E039 Hypothyroidism, unspecified: Secondary | ICD-10-CM

## 2019-09-08 NOTE — Telephone Encounter (Signed)
Called patient, no answer, left message to return call 

## 2019-09-08 NOTE — Telephone Encounter (Signed)
Last OV 07/19/2018. Last RF 30 day supply sent 08/30/19. Next OV -not scheduled  ntbs for further refills

## 2019-10-05 ENCOUNTER — Ambulatory Visit: Payer: 59 | Admitting: Family Medicine

## 2019-10-06 ENCOUNTER — Encounter: Payer: Self-pay | Admitting: Family Medicine

## 2019-10-17 ENCOUNTER — Other Ambulatory Visit: Payer: Self-pay

## 2019-10-17 ENCOUNTER — Ambulatory Visit (INDEPENDENT_AMBULATORY_CARE_PROVIDER_SITE_OTHER): Payer: 59 | Admitting: Nurse Practitioner

## 2019-10-17 ENCOUNTER — Encounter: Payer: Self-pay | Admitting: Nurse Practitioner

## 2019-10-17 VITALS — BP 126/91 | HR 97 | Temp 98.1°F | Resp 20 | Ht 70.0 in | Wt 243.0 lb

## 2019-10-17 DIAGNOSIS — E7800 Pure hypercholesterolemia, unspecified: Secondary | ICD-10-CM

## 2019-10-17 DIAGNOSIS — E039 Hypothyroidism, unspecified: Secondary | ICD-10-CM

## 2019-10-17 DIAGNOSIS — F320 Major depressive disorder, single episode, mild: Secondary | ICD-10-CM | POA: Diagnosis not present

## 2019-10-17 MED ORDER — ESCITALOPRAM OXALATE 5 MG PO TABS
5.0000 mg | ORAL_TABLET | Freq: Every day | ORAL | 0 refills | Status: DC
Start: 2019-10-17 — End: 2019-12-13

## 2019-10-17 NOTE — Progress Notes (Signed)
Established Patient Office Visit  Subjective:  Patient ID: Jordan Barnett, male    DOB: [birth year]  Age: 29 y.o. MRN: [id number]  CC:  Chief Complaint  Patient presents with  . Labwork    HPI Jordan Barnett presents for Mixed hyperlipidemia  Pt presents with hyperlipidemia. Patient was diagnosed in 05/22/2016.  Patient is not on any current medication.;  Patient maintains a low cholesterol diet , follows up as directed , and maintains an exercise regimen . The patient denies experiencing any hypercholesterolemia related symptoms.    Depression: Patient complains of depression. He complains of increased fatigue, angry most of the time, no interest or pleasure in doing things. Onset was approximately a few years ago.  Patient was on an antidepressant medication but stopped taking it for a period of time.  Patient reports antidepressant made him suicidal so stopped taking medication.  Patient is reporting increased stressors from home and long hours of work. He denies current suicidal and homicidal plan or intent.  Patient unable to remember previous medication treatment, family history not significant for depression.    Thyroid Problem Presents for follow-up (Hypothyroidism first diagnosed in 2020.) visit. Symptoms include anxiety and fatigue. The symptoms have been improving.  Patient has been compliant with medication regimen.  Patient currently is taking Synthroid 100 mcg 1 tablet daily.  Patient is following up as required.  Patient is not reporting any side effects from current medication.  Past Medical History:  Diagnosis Date  . Sciatica   . Thyroid disease     Past Surgical History:  Procedure Laterality Date  . LAPAROSCOPIC APPENDECTOMY N/A 12/07/2012   Procedure: APPENDECTOMY LAPAROSCOPIC;  Surgeon: Atilano Ina, MD;  Location: [facility] OR;  Service: General;  Laterality: N/A;    Family History  Problem Relation Age of Onset  . Hypertension Mother   . Diabetes Father      Social History   Socioeconomic History  . Marital status: Married    Spouse name: Not on file  . Number of children: Not on file  . Years of education: Not on file  . Highest education level: Not on file  Occupational History  . Not on file  Tobacco Use  . Smoking status: Former Games developer  . Smokeless tobacco: Former Clinical biochemist  . Vaping Use: Never used  Substance and Sexual Activity  . Alcohol use: No  . Drug use: No  . Sexual activity: Yes  Other Topics Concern  . Not on file  Social History Narrative  . Not on file   Social Determinants of Health   Financial Resource Strain:   . Difficulty of Paying Living Expenses:   Food Insecurity:   . Worried About Programme researcher, broadcasting/film/video in the Last Year:   . Barista in the Last Year:   Transportation Needs:   . Freight forwarder (Medical):   Marland Kitchen Lack of Transportation (Non-Medical):   Physical Activity:   . Days of Exercise per Week:   . Minutes of Exercise per Session:   Stress:   . Feeling of Stress :   Social Connections:   . Frequency of Communication with Friends and Family:   . Frequency of Social Gatherings with Friends and Family:   . Attends Religious Services:   . Active Member of Clubs or Organizations:   . Attends Banker Meetings:   Marland Kitchen Marital Status:   Intimate Partner Violence:   . Fear of Current or Ex-Partner:   .  Emotionally Abused:   Marland Kitchen Physically Abused:   . Sexually Abused:     Outpatient Medications Prior to Visit  Medication Sig Dispense Refill  . levothyroxine (SYNTHROID) 100 MCG tablet TAKE ONE (1) TABLET EACH DAY (Needs to be seen before next refill) 30 tablet 0   No facility-administered medications prior to visit.    Allergies  Allergen Reactions  . Fenofibrate Micronized     Sexual dysfunc    ROS Review of Systems  Constitutional: Positive for fatigue.  Respiratory: Negative.   Cardiovascular: Negative.   Gastrointestinal: Negative.    Genitourinary: Negative.   Musculoskeletal: Negative.   Skin: Negative.   Neurological: Negative.   Psychiatric/Behavioral: Positive for agitation and decreased concentration. Negative for confusion, self-injury, sleep disturbance and suicidal ideas. The patient is nervous/anxious. The patient is not hyperactive.       Objective:    Physical Exam Constitutional:      Appearance: Normal appearance. He is obese.  HENT:     Head: Normocephalic.  Eyes:     Conjunctiva/sclera: Conjunctivae normal.  Cardiovascular:     Rate and Rhythm: Normal rate and regular rhythm.     Pulses: Normal pulses.     Heart sounds: Normal heart sounds.  Pulmonary:     Effort: Pulmonary effort is normal.     Breath sounds: Normal breath sounds.  Abdominal:     General: Bowel sounds are normal.  Musculoskeletal:        General: Normal range of motion.  Skin:    General: Skin is warm.  Neurological:     Mental Status: He is alert and oriented to person, place, and time.  Psychiatric:     Comments: Depression     BP (!) 126/91   Pulse 97   Temp 98.1 F (36.7 C) (Temporal)   Resp 20   Ht 5\' 10"  (1.778 m)   Wt 243 lb (110.2 kg)   SpO2 97%   BMI 34.87 kg/m  Wt Readings from Last 3 Encounters:  06/19/19 250 lb (113.4 kg)  07/19/18 245 lb 9.6 oz (111.4 kg)  03/30/18 238 lb (108 kg)     Health Maintenance Due  Topic Date Due  . Hepatitis C Screening  Never done  . HIV Screening  Never done  . INFLUENZA VACCINE  10/15/2019    There are no preventive care reminders to display for this patient.  Lab Results  Component Value Date   TSH 3.000 07/19/2018   Lab Results  Component Value Date   WBC 6.1 07/01/2017   HGB 15.0 07/01/2017   HCT 44.3 07/01/2017   MCV 89 07/01/2017   PLT 269 07/01/2017   Lab Results  Component Value Date   NA 139 07/19/2018   K 4.1 07/19/2018   CO2 23 07/19/2018   GLUCOSE 89 07/19/2018   BUN 14 07/19/2018   CREATININE 1.02 07/19/2018   BILITOT 0.4  07/19/2018   ALKPHOS 69 07/19/2018   AST 22 07/19/2018   ALT 37 07/19/2018   PROT 7.2 07/19/2018   ALBUMIN 4.7 07/19/2018   CALCIUM 9.7 07/19/2018   Lab Results  Component Value Date   CHOL 183 07/01/2017   Lab Results  Component Value Date   HDL 26 (L) 07/01/2017   Lab Results  Component Value Date   LDLCALC 86 07/01/2017   Lab Results  Component Value Date   TRIG 356 (H) 07/01/2017   Lab Results  Component Value Date   CHOLHDL 7.0 (H) 07/01/2017  No results found for: HGBA1C    Office Visit from 10/17/2019 in [facility]  PHQ-9 Total Score 14          Assessment & Plan:   Problem List Items Addressed This Visit      Endocrine   Hypothyroidism - Primary    Patient is following up today for hypothyroidism.  Patient reports compliance with treatment and has not had any side effect from medication.  Current symptoms include anxiety and fatigue.  The symptoms have been improving. Provided education to patient with printed handouts given Labs completed today, TSH. Follow-up with worsening or unresolved symptoms.      Relevant Orders   Comprehensive metabolic panel   CBC with Differential   TSH     Other   Elevated cholesterol    Patient is a 29 year old who presents to clinic with mixed hyperlipidemia, diagnosed in 2018.  Well managed.  Patient is not on any current medication, patient maintains a low cholesterol diet and follows up as directed he maintains an exercise regimen, he denies any hypercholesterolemia related symptoms. Labs completed today, CMP, lipid panel.      Relevant Orders   Lipid Panel   Depression, major, single episode, mild (HCC)    Patient is presenting today for depression that is not well managed.  Patient was first diagnosed in 2020 he complains of increased fatigue, angry most of the time, no interest or pleasure in doing things.  Patient was on an antidepressant and stopped taking medication due to thoughts  of suicide.  Patient reports increased stressors at home and long hours of work contributed to depression.  He currently denies any suicidal or homicidal plan for today.  Patient cannot remember exactly what medication he used at the time. Provided education to patient with printed handouts given. Started patient on 5 mg Lexapro daily. Advised patient to follow-up with worsening or uncontrolled symptoms.  Patient follow-up in 4 weeks. Rx sent to pharmacy.      Relevant Medications   escitalopram (LEXAPRO) 5 MG tablet      No orders of the defined types were placed in this encounter.   Follow-up: Return in about 4 weeks (around 11/14/2019), or if symptoms worsen or fail to improve. Started new medication (lexapro).    Daryll Drown, NP

## 2019-10-17 NOTE — Assessment & Plan Note (Signed)
Patient is presenting today for depression that is not well managed.  Patient was first diagnosed in 2020 he complains of increased fatigue, angry most of the time, no interest or pleasure in doing things.  Patient was on an antidepressant and stopped taking medication due to thoughts of suicide.  Patient reports increased stressors at home and long hours of work contributed to depression.  He currently denies any suicidal or homicidal plan for today.  Patient cannot remember exactly what medication he used at the time. Provided education to patient with printed handouts given. Started patient on 5 mg Lexapro daily. Advised patient to follow-up with worsening or uncontrolled symptoms.  Patient follow-up in 4 weeks. Rx sent to pharmacy.

## 2019-10-17 NOTE — Assessment & Plan Note (Signed)
Patient is following up today for hypothyroidism.  Patient reports compliance with treatment and has not had any side effect from medication.  Current symptoms include anxiety and fatigue.  The symptoms have been improving. Provided education to patient with printed handouts given Labs completed today, TSH. Follow-up with worsening or unresolved symptoms.

## 2019-10-17 NOTE — Patient Instructions (Addendum)
Depression, major, single episode, mild (HCC) Patient is presenting today for depression that is not well managed.  Patient was first diagnosed in 2020 he complains of increased fatigue, angry most of the time, no interest or pleasure in doing things.  Patient was on an antidepressant and stopped taking medication due to thoughts of suicide.  Patient reports increased stressors at home and long hours of work contributed to depression.  He currently denies any suicidal or homicidal plan for today.  Patient cannot remember exactly what medication he used at the time. Provided education to patient with printed handouts given. Started patient on 5 mg Lexapro daily. Advised patient to follow-up with worsening or uncontrolled symptoms.  Patient follow-up in 4 weeks. Rx sent to pharmacy.  Elevated cholesterol Patient is a 29 year old who presents to clinic with mixed hyperlipidemia, diagnosed in 2018.  Well managed.  Patient is not on any current medication, patient maintains a low cholesterol diet and follows up as directed he maintains an exercise regimen, he denies any hypercholesterolemia related symptoms. Labs completed today, CMP, lipid panel.  Hypothyroidism Patient is following up today for hypothyroidism.  Patient reports compliance with treatment and has not had any side effect from medication.  Current symptoms include anxiety and fatigue.  The symptoms have been improving. Provided education to patient with printed handouts given Labs completed today, TSH. Follow-up with worsening or unresolved symptoms.    High Cholesterol  High cholesterol is a condition in which the blood has high levels of a white, waxy, fat-like substance (cholesterol). The human body needs small amounts of cholesterol. The liver makes all the cholesterol that the body needs. Extra (excess) cholesterol comes from the food that we eat. Cholesterol is carried from the liver by the blood through the blood vessels. If you  have high cholesterol, deposits (plaques) may build up on the walls of your blood vessels (arteries). Plaques make the arteries narrower and stiffer. Cholesterol plaques increase your risk for heart attack and stroke. Work with your health care provider to keep your cholesterol levels in a healthy range. What increases the risk? This condition is more likely to develop in people who:  Eat foods that are high in animal fat (saturated fat) or cholesterol.  Are overweight.  Are not getting enough exercise.  Have a family history of high cholesterol. What are the signs or symptoms? There are no symptoms of this condition. How is this diagnosed? This condition may be diagnosed from the results of a blood test.  If you are older than age 30, your health care provider may check your cholesterol every 4-6 years.  You may be checked more often if you already have high cholesterol or other risk factors for heart disease. The blood test for cholesterol measures:  "Bad" cholesterol (LDL cholesterol). This is the main type of cholesterol that causes heart disease. The desired level for LDL is less than 100.  "Good" cholesterol (HDL cholesterol). This type helps to protect against heart disease by cleaning the arteries and carrying the LDL away. The desired level for HDL is 60 or higher.  Triglycerides. These are fats that the body can store or burn for energy. The desired number for triglycerides is lower than 150.  Total cholesterol. This is a measure of the total amount of cholesterol in your blood, including LDL cholesterol, HDL cholesterol, and triglycerides. A healthy number is less than 200. How is this treated? This condition is treated with diet changes, lifestyle changes, and medicines. Diet changes  This may include eating more whole grains, fruits, vegetables, nuts, and fish.  This may also include cutting back on red meat and foods that have a lot of added sugar. Lifestyle  changes  Changes may include getting at least 40 minutes of aerobic exercise 3 times a week. Aerobic exercises include walking, biking, and swimming. Aerobic exercise along with a healthy diet can help you maintain a healthy weight.  Changes may also include quitting smoking. Medicines  Medicines are usually given if diet and lifestyle changes have failed to reduce your cholesterol to healthy levels.  Your health care provider may prescribe a statin medicine. Statin medicines have been shown to reduce cholesterol, which can reduce the risk of heart disease. Follow these instructions at home: Eating and drinking If told by your health care provider:  Eat chicken (without skin), fish, veal, shellfish, ground [place] breast, and round or loin cuts of red meat.  Do not eat fried foods or fatty meats, such as hot dogs and salami.  Eat plenty of fruits, such as apples.  Eat plenty of vegetables, such as broccoli, potatoes, and carrots.  Eat beans, peas, and lentils.  Eat grains such as barley, rice, couscous, and bulgur wheat.  Eat pasta without cream sauces.  Use skim or nonfat milk, and eat low-fat or nonfat yogurt and cheeses.  Do not eat or drink whole milk, cream, ice cream, egg yolks, or hard cheeses.  Do not eat stick margarine or tub margarines that contain trans fats (also called partially hydrogenated oils).  Do not eat saturated tropical oils, such as coconut oil and palm oil.  Do not eat cakes, cookies, crackers, or other baked goods that contain trans fats.  General instructions  Exercise as directed by your health care provider. Increase your activity level with activities such as gardening, walking, and taking the stairs.  Take over-the-counter and prescription medicines only as told by your health care provider.  Do not use any products that contain nicotine or tobacco, such as cigarettes and e-cigarettes. If you need help quitting, ask your health care  provider.  Keep all follow-up visits as told by your health care provider. This is important. Contact a health care provider if:  You are struggling to maintain a healthy diet or weight.  You need help to start on an exercise program.  You need help to stop smoking. Get help right away if:  You have chest pain.  You have trouble breathing. This information is not intended to replace advice given to you by your health care provider. Make sure you discuss any questions you have with your health care provider. Document Revised: 03/05/2017 Document Reviewed: 08/31/2015 Elsevier Patient Education  2020 [employer] Hypothyroidism  Hypothyroidism is when the thyroid gland does not make enough of certain hormones (it is underactive). The thyroid gland is a small gland located in the lower front part of the neck, just in front of the windpipe (trachea). This gland makes hormones that help control how the body uses food for energy (metabolism) as well as how the heart and brain function. These hormones also play a role in keeping your bones strong. When the thyroid is underactive, it produces too little of the hormones thyroxine (T4) and triiodothyronine (T3). What are the causes? This condition may be caused by:  Hashimoto's disease. This is a disease in which the body's disease-fighting system (immune system) attacks the thyroid gland. This is the most common cause.  Viral infections.  Pregnancy.  Certain  medicines.  Birth defects.  Past radiation treatments to the head or neck for cancer.  Past treatment with radioactive iodine.  Past exposure to radiation in the environment.  Past surgical removal of part or all of the thyroid.  Problems with a gland in the center of the brain (pituitary gland).  Lack of enough iodine in the diet. What increases the risk? You are more likely to develop this condition if:  You are male.  You have a family history of thyroid  conditions.  You use a medicine called lithium.  You take medicines that affect the immune system (immunosuppressants). What are the signs or symptoms? Symptoms of this condition include:  Feeling as though you have no energy (lethargy).  Not being able to tolerate cold.  Weight gain that is not explained by a change in diet or exercise habits.  Lack of appetite.  Dry skin.  Coarse hair.  Menstrual irregularity.  Slowing of thought processes.  Constipation.  Sadness or depression. How is this diagnosed? This condition may be diagnosed based on:  Your symptoms, your medical history, and a physical exam.  Blood tests. You may also have imaging tests, such as an ultrasound or MRI. How is this treated? This condition is treated with medicine that replaces the thyroid hormones that your body does not make. After you begin treatment, it may take several weeks for symptoms to go away. Follow these instructions at home:  Take over-the-counter and prescription medicines only as told by your health care provider.  If you start taking any new medicines, tell your health care provider.  Keep all follow-up visits as told by your health care provider. This is important. ? As your condition improves, your dosage of thyroid hormone medicine may change. ? You will need to have blood tests regularly so that your health care provider can monitor your condition. Contact a health care provider if:  Your symptoms do not get better with treatment.  You are taking thyroid replacement medicine and you: ? Sweat a lot. ? Have tremors. ? Feel anxious. ? Lose weight rapidly. ? Cannot tolerate heat. ? Have emotional swings. ? Have diarrhea. ? Feel weak. Get help right away if you have:  Chest pain.  An irregular heartbeat.  A rapid heartbeat.  Difficulty breathing. Summary  Hypothyroidism is when the thyroid gland does not make enough of certain hormones (it is  underactive).  When the thyroid is underactive, it produces too little of the hormones thyroxine (T4) and triiodothyronine (T3).  The most common cause is Hashimoto's disease, a disease in which the body's disease-fighting system (immune system) attacks the thyroid gland. The condition can also be caused by viral infections, medicine, pregnancy, or past radiation treatment to the head or neck.  Symptoms may include weight gain, dry skin, constipation, feeling as though you do not have energy, and not being able to tolerate cold.  This condition is treated with medicine to replace the thyroid hormones that your body does not make. This information is not intended to replace advice given to you by your health care provider. Make sure you discuss any questions you have with your health care provider. Document Revised: 02/12/2017 Document Reviewed: 02/10/2017 Elsevier Patient Education  2020 [employer] Inc[employer]  Living With Depression Everyone experiences occasional disappointment, sadness, and loss in their lives. When you are feeling down, blue, or sad for at least 2 weeks in a row, it may mean that you have depression. Depression can affect your thoughts and  feelings, relationships, daily activities, and physical health. It is caused by changes in the way your brain functions. If you receive a diagnosis of depression, your health care provider will tell you which type of depression you have and what treatment options are available to you. If you are living with depression, there are ways to help you recover from it and also ways to prevent it from coming back. How to cope with lifestyle changes Coping with stress     Stress is your body's reaction to life changes and events, both good and bad. Stressful situations may include:  Getting married.  The death of a spouse.  Losing a job.  Retiring.  Having a baby. Stress can last just a few hours or it can be ongoing. Stress can play a major  role in depression, so it is important to learn both how to cope with stress and how to think about it differently. Talk with your health care provider or a counselor if you would like to learn more about stress reduction. He or she may suggest some stress reduction techniques, such as:  Music therapy. This can include creating music or listening to music. Choose music that you enjoy and that inspires you.  Mindfulness-based meditation. This kind of meditation can be done while sitting or walking. It involves being aware of your normal breaths, rather than trying to control your breathing.  Centering prayer. This is a kind of meditation that involves focusing on a spiritual word or phrase. Choose a word, phrase, or sacred image that is meaningful to you and that brings you peace.  Deep breathing. To do this, expand your stomach and inhale slowly through your nose. Hold your breath for 3-5 seconds, then exhale slowly, allowing your stomach muscles to relax.  Muscle relaxation. This involves intentionally tensing muscles then relaxing them. Choose a stress reduction technique that fits your lifestyle and personality. Stress reduction techniques take time and practice to develop. Set aside 5-15 minutes a day to do them. Therapists can offer training in these techniques. The training may be covered by some insurance plans. Other things you can do to manage stress include:  Keeping a stress diary. This can help you learn what triggers your stress and ways to control your response.  Understanding what your limits are and saying no to requests or events that lead to a schedule that is too full.  Thinking about how you respond to certain situations. You may not be able to control everything, but you can control how you react.  Adding humor to your life by watching funny films or TV shows.  Making time for activities that help you relax and not feeling guilty about spending your time this  way.  Medicines Your health care provider may suggest certain medicines if he or she feels that they will help improve your condition. Avoid using alcohol and other substances that may prevent your medicines from working properly (may interact). It is also important to:  Talk with your pharmacist or health care provider about all the medicines that you take, their possible side effects, and what medicines are safe to take together.  Make it your goal to take part in all treatment decisions (shared decision-making). This includes giving input on the side effects of medicines. It is best if shared decision-making with your health care provider is part of your total treatment plan. If your health care provider prescribes a medicine, you may not notice the full benefits of it for  4-8 weeks. Most people who are treated for depression need to be on medicine for at least 6-12 months after they feel better. If you are taking medicines as part of your treatment, do not stop taking medicines without first talking to your health care provider. You may need to have the medicine slowly decreased (tapered) over time to decrease the risk of harmful side effects. Relationships Your health care provider may suggest family therapy along with individual therapy and drug therapy. While there may not be family problems that are causing you to feel depressed, it is still important to make sure your family learns as much as they can about your mental health. Having your family's support can help make your treatment successful. How to recognize changes in your condition Everyone has a different response to treatment for depression. Recovery from major depression happens when you have not had signs of major depression for two months. This may mean that you will start to:  Have more interest in doing activities.  Feel less hopeless than you did 2 months ago.  Have more energy.  Overeat less often, or have better or  improving appetite.  Have better concentration. Your health care provider will work with you to decide the next steps in your recovery. It is also important to recognize when your condition is getting worse. Watch for these signs:  Having fatigue or low energy.  Eating too much or too little.  Sleeping too much or too little.  Feeling restless, agitated, or hopeless.  Having trouble concentrating or making decisions.  Having unexplained physical complaints.  Feeling irritable, angry, or aggressive. Get help as soon as you or your family members notice these symptoms coming back. How to get support and help from others How to talk with friends and family members about your condition  Talking to friends and family members about your condition can provide you with one way to get support and guidance. Reach out to trusted friends or family members, explain your symptoms to them, and let them know that you are working with a health care provider to treat your depression. Financial resources Not all insurance plans cover mental health care, so it is important to check with your insurance carrier. If paying for co-pays or counseling services is a problem, search for a local or county mental health care center. They may be able to offer public mental health care services at low or no cost when you are not able to see a private health care provider. If you are taking medicine for depression, you may be able to get the generic form, which may be less expensive. Some makers of prescription medicines also offer help to patients who cannot afford the medicines they need. Follow these instructions at home:   Get the right amount and quality of sleep.  Cut down on using caffeine, tobacco, alcohol, and other potentially harmful substances.  Try to exercise, such as walking or lifting small weights.  Take over-the-counter and prescription medicines only as told by your health care provider.  Eat  a healthy diet that includes plenty of vegetables, fruits, whole grains, low-fat dairy products, and lean protein. Do not eat a lot of foods that are high in solid fats, added sugars, or salt.  Keep all follow-up visits as told by your health care provider. This is important. Contact a health care provider if:  You stop taking your antidepressant medicines, and you have any of these symptoms: ? Nausea. ? Headache. ? Feeling  lightheaded. ? Chills and body aches. ? Not being able to sleep (insomnia).  You or your friends and family think your depression is getting worse. Get help right away if:  You have thoughts of hurting yourself or others. If you ever feel like you may hurt yourself or others, or have thoughts about taking your own life, get help right away. You can go to your nearest emergency department or call:  Your local emergency services (911 in the [place]  A suicide crisis helpline, such as the National Suicide Prevention Lifeline at (567)207-8306. This is open 24-hours a day. Summary  If you are living with depression, there are ways to help you recover from it and also ways to prevent it from coming back.  Work with your health care team to create a management plan that includes counseling, stress management techniques, and healthy lifestyle habits. This information is not intended to replace advice given to you by your health care provider. Make sure you discuss any questions you have with your health care provider. Document Revised: 06/24/2018 Document Reviewed: 02/03/2016 Elsevier Patient Education  2020 [employer].

## 2019-10-17 NOTE — Assessment & Plan Note (Deleted)
Patient is a 29 year old who presents to clinic with mixed hyperlipidemia, diagnosed in 2018.  Well managed.  Patient is not on any current medication, patient maintains a low cholesterol diet and follows up as directed he maintains an exercise regimen, he denies any hypercholesterolemia related symptoms. Labs completed today, CMP, lipid panel.

## 2019-10-17 NOTE — Assessment & Plan Note (Signed)
Patient is a 29-year-old who presents to clinic with mixed hyperlipidemia, diagnosed in 2018.  Well managed.  Patient is not on any current medication, patient maintains a low cholesterol diet and follows up as directed he maintains an exercise regimen, he denies any hypercholesterolemia related symptoms. Labs completed today, CMP, lipid panel. 

## 2019-10-18 LAB — CBC WITH DIFFERENTIAL/PLATELET
Basophils Absolute: 0.1 10*3/uL (ref 0.0–0.2)
Basos: 1 %
EOS (ABSOLUTE): 0.1 10*3/uL (ref 0.0–0.4)
Eos: 2 %
Hematocrit: 48 % (ref 37.5–51.0)
Hemoglobin: 16 g/dL (ref 13.0–17.7)
Immature Grans (Abs): 0 10*3/uL (ref 0.0–0.1)
Immature Granulocytes: 0 %
Lymphocytes Absolute: 2.7 10*3/uL (ref 0.7–3.1)
Lymphs: 32 %
MCH: 29.6 pg (ref 26.6–33.0)
MCHC: 33.3 g/dL (ref 31.5–35.7)
MCV: 89 fL (ref 79–97)
Monocytes Absolute: 0.6 10*3/uL (ref 0.1–0.9)
Monocytes: 7 %
Neutrophils Absolute: 4.9 10*3/uL (ref 1.4–7.0)
Neutrophils: 58 %
Platelets: 345 10*3/uL (ref 150–450)
RBC: 5.41 x10E6/uL (ref 4.14–5.80)
RDW: 13 % (ref 11.6–15.4)
WBC: 8.4 10*3/uL (ref 3.4–10.8)

## 2019-10-18 LAB — LIPID PANEL
Chol/HDL Ratio: 7.8 ratio — ABNORMAL HIGH (ref 0.0–5.0)
Cholesterol, Total: 211 mg/dL — ABNORMAL HIGH (ref 100–199)
HDL: 27 mg/dL — ABNORMAL LOW (ref >39)
LDL Chol Calc (NIH): 97 mg/dL (ref 0–99)
Triglycerides: 518 mg/dL — ABNORMAL HIGH (ref 0–149)
VLDL Cholesterol Cal: 87 mg/dL — ABNORMAL HIGH (ref 5–40)

## 2019-10-18 LAB — TSH: TSH: 4.81 u[IU]/mL — ABNORMAL HIGH (ref 0.450–4.500)

## 2019-10-19 LAB — T4: T4, Total: 6 ug/dL (ref 4.5–12.0)

## 2019-10-19 LAB — SPECIMEN STATUS REPORT

## 2019-10-19 LAB — T3: T3, Total: 98 ng/dL (ref 71–180)

## 2019-10-19 NOTE — Progress Notes (Signed)
LABS ADDED AS REQUESTED

## 2019-10-20 ENCOUNTER — Other Ambulatory Visit: Payer: Self-pay | Admitting: Nurse Practitioner

## 2019-10-20 MED ORDER — ROSUVASTATIN CALCIUM 5 MG PO TABS: 5.0000 mg | ORAL_TABLET | Freq: Every day | ORAL | 0 refills | Status: DC

## 2019-10-30 ENCOUNTER — Other Ambulatory Visit: Payer: Self-pay | Admitting: Family Medicine

## 2019-10-30 DIAGNOSIS — E039 Hypothyroidism, unspecified: Secondary | ICD-10-CM

## 2019-11-23 ENCOUNTER — Ambulatory Visit: Payer: 59 | Admitting: Nurse Practitioner

## 2019-11-23 ENCOUNTER — Other Ambulatory Visit: Payer: Self-pay

## 2019-11-23 ENCOUNTER — Encounter: Payer: Self-pay | Admitting: Nurse Practitioner

## 2019-11-23 VITALS — BP 118/77 | HR 83 | Temp 97.7°F | Resp 20 | Ht 70.0 in | Wt 251.0 lb

## 2019-11-23 DIAGNOSIS — F320 Major depressive disorder, single episode, mild: Secondary | ICD-10-CM

## 2019-11-23 NOTE — Assessment & Plan Note (Signed)
Depression is well managed with current medication.  Patient is currently on 5 mg Lexapro daily.  No changes to medication dose.  Completed PHQ-9 score is is 10 today, GAD-7 score is a 7 today.  Patient is progressively improving.  Continue to provide education on living with depression.  Advised patient to continue medication as prescribed.  Follow-up in 3 months.

## 2019-11-23 NOTE — Patient Instructions (Signed)
Living With Depression Everyone experiences occasional disappointment, sadness, and loss in their lives. When you are feeling down, blue, or sad for at least 2 weeks in a row, it may mean that you have depression. Depression can affect your thoughts and feelings, relationships, daily activities, and physical health. It is caused by changes in the way your brain functions. If you receive a diagnosis of depression, your health care provider will tell you which type of depression you have and what treatment options are available to you. If you are living with depression, there are ways to help you recover from it and also ways to prevent it from coming back. How to cope with lifestyle changes Coping with stress     Stress is your body's reaction to life changes and events, both good and bad. Stressful situations may include:  Getting married.  The death of a spouse.  Losing a job.  Retiring.  Having a baby. Stress can last just a few hours or it can be ongoing. Stress can play a major role in depression, so it is important to learn both how to cope with stress and how to think about it differently. Talk with your health care provider or a counselor if you would like to learn more about stress reduction. He or she may suggest some stress reduction techniques, such as:  Music therapy. This can include creating music or listening to music. Choose music that you enjoy and that inspires you.  Mindfulness-based meditation. This kind of meditation can be done while sitting or walking. It involves being aware of your normal breaths, rather than trying to control your breathing.  Centering prayer. This is a kind of meditation that involves focusing on a spiritual word or phrase. Choose a word, phrase, or sacred image that is meaningful to you and that brings you peace.  Deep breathing. To do this, expand your stomach and inhale slowly through your nose. Hold your breath for 3-5 seconds, then exhale  slowly, allowing your stomach muscles to relax.  Muscle relaxation. This involves intentionally tensing muscles then relaxing them. Choose a stress reduction technique that fits your lifestyle and personality. Stress reduction techniques take time and practice to develop. Set aside 5-15 minutes a day to do them. Therapists can offer training in these techniques. The training may be covered by some insurance plans. Other things you can do to manage stress include:  Keeping a stress diary. This can help you learn what triggers your stress and ways to control your response.  Understanding what your limits are and saying no to requests or events that lead to a schedule that is too full.  Thinking about how you respond to certain situations. You may not be able to control everything, but you can control how you react.  Adding humor to your life by watching funny films or TV shows.  Making time for activities that help you relax and not feeling guilty about spending your time this way.  Medicines Your health care provider may suggest certain medicines if he or she feels that they will help improve your condition. Avoid using alcohol and other substances that may prevent your medicines from working properly (may interact). It is also important to:  Talk with your pharmacist or health care provider about all the medicines that you take, their possible side effects, and what medicines are safe to take together.  Make it your goal to take part in all treatment decisions (shared decision-making). This includes giving input on   the side effects of medicines. It is best if shared decision-making with your health care provider is part of your total treatment plan. If your health care provider prescribes a medicine, you may not notice the full benefits of it for 4-8 weeks. Most people who are treated for depression need to be on medicine for at least 6-12 months after they feel better. If you are taking  medicines as part of your treatment, do not stop taking medicines without first talking to your health care provider. You may need to have the medicine slowly decreased (tapered) over time to decrease the risk of harmful side effects. Relationships Your health care provider may suggest family therapy along with individual therapy and drug therapy. While there may not be family problems that are causing you to feel depressed, it is still important to make sure your family learns as much as they can about your mental health. Having your family's support can help make your treatment successful. How to recognize changes in your condition Everyone has a different response to treatment for depression. Recovery from major depression happens when you have not had signs of major depression for two months. This may mean that you will start to:  Have more interest in doing activities.  Feel less hopeless than you did 2 months ago.  Have more energy.  Overeat less often, or have better or improving appetite.  Have better concentration. Your health care provider will work with you to decide the next steps in your recovery. It is also important to recognize when your condition is getting worse. Watch for these signs:  Having fatigue or low energy.  Eating too much or too little.  Sleeping too much or too little.  Feeling restless, agitated, or hopeless.  Having trouble concentrating or making decisions.  Having unexplained physical complaints.  Feeling irritable, angry, or aggressive. Get help as soon as you or your family members notice these symptoms coming back. How to get support and help from others How to talk with friends and family members about your condition  Talking to friends and family members about your condition can provide you with one way to get support and guidance. Reach out to trusted friends or family members, explain your symptoms to them, and let them know that you are  working with a health care provider to treat your depression. Financial resources Not all insurance plans cover mental health care, so it is important to check with your insurance carrier. If paying for co-pays or counseling services is a problem, search for a local or county mental health care center. They may be able to offer public mental health care services at low or no cost when you are not able to see a private health care provider. If you are taking medicine for depression, you may be able to get the generic form, which may be less expensive. Some makers of prescription medicines also offer help to patients who cannot afford the medicines they need. Follow these instructions at home:   Get the right amount and quality of sleep.  Cut down on using caffeine, tobacco, alcohol, and other potentially harmful substances.  Try to exercise, such as walking or lifting small weights.  Take over-the-counter and prescription medicines only as told by your health care provider.  Eat a healthy diet that includes plenty of vegetables, fruits, whole grains, low-fat dairy products, and lean protein. Do not eat a lot of foods that are high in solid fats, added sugars, or salt.    Keep all follow-up visits as told by your health care provider. This is important. Contact a health care provider if:  You stop taking your antidepressant medicines, and you have any of these symptoms: ? Nausea. ? Headache. ? Feeling lightheaded. ? Chills and body aches. ? Not being able to sleep (insomnia).  You or your friends and family think your depression is getting worse. Get help right away if:  You have thoughts of hurting yourself or others. If you ever feel like you may hurt yourself or others, or have thoughts about taking your own life, get help right away. You can go to your nearest emergency department or call:  Your local emergency services (911 in the [place]  A suicide crisis helpline, such as the  National Suicide Prevention Lifeline at 1-800-273-8255. This is open 24-hours a day. Summary  If you are living with depression, there are ways to help you recover from it and also ways to prevent it from coming back.  Work with your health care team to create a management plan that includes counseling, stress management techniques, and healthy lifestyle habits. This information is not intended to replace advice given to you by your health care provider. Make sure you discuss any questions you have with your health care provider. Document Revised: 06/24/2018 Document Reviewed: 02/03/2016 Elsevier Patient Education  2020 Elsevier Inc.  

## 2019-11-23 NOTE — Progress Notes (Signed)
Established Patient Office Visit  Subjective:  Patient ID: Jordan Barnett, male    DOB: [birth year]  Age: 29 y.o. MRN: [id number]  CC:  Chief Complaint  Patient presents with   Depression    4 week follow up     HPI Jordan Barnett presents for Depression: Patient complains of depression. He complains of difficulty concentrating and fatigue. Onset was approximately 1 year ago, gradually improving since that time.  He denies current suicidal and homicidal plan or intent.   Family history significant for no psychiatric illness.Possible organic causes contributing are: none.  Risk factors: none Previous treatment includes Lexapro. He complains of the following side effects from the treatment: none.  Past Medical History:  Diagnosis Date   Sciatica    Thyroid disease     Past Surgical History:  Procedure Laterality Date   LAPAROSCOPIC APPENDECTOMY N/A 12/07/2012   Procedure: APPENDECTOMY LAPAROSCOPIC;  Surgeon: Atilano Ina, MD;  Location: [facility] OR;  Service: General;  Laterality: N/A;    Family History  Problem Relation Age of Onset   Hypertension Mother    Diabetes Father     Social History   Socioeconomic History   Marital status: Married    Spouse name: Not on file   Number of children: Not on file   Years of education: Not on file   Highest education level: Not on file  Occupational History   Not on file  Tobacco Use   Smoking status: Former Smoker    Packs/day: 1.00    Years: 10.00    Pack years: 10.00    Quit date: 03/16/2013    Years since quitting: 6.6   Smokeless tobacco: Former Forensic psychologist Use: Never used  Substance and Sexual Activity   Alcohol use: No   Drug use: No   Sexual activity: Yes  Other Topics Concern   Not on file  Social History Narrative   Not on file   Social Determinants of Health   Financial Resource Strain:    Difficulty of Paying Living Expenses: Not on file  Food Insecurity:    Worried About  Programme researcher, broadcasting/film/video in the Last Year: Not on file   [employer] of Food in the Last Year: Not on file  Transportation Needs:    Lack of Transportation (Medical): Not on file   Lack of Transportation (Non-Medical): Not on file  Physical Activity:    Days of Exercise per Week: Not on file   Minutes of Exercise per Session: Not on file  Stress:    Feeling of Stress : Not on file  Social Connections:    Frequency of Communication with Friends and Family: Not on file   Frequency of Social Gatherings with Friends and Family: Not on file   Attends Religious Services: Not on file   Active Member of Clubs or Organizations: Not on file   Attends Banker Meetings: Not on file   Marital Status: Not on file  Intimate Partner Violence:    Fear of Current or Ex-Partner: Not on file   Emotionally Abused: Not on file   Physically Abused: Not on file   Sexually Abused: Not on file    Outpatient Medications Prior to Visit  Medication Sig Dispense Refill   escitalopram (LEXAPRO) 5 MG tablet Take 1 tablet (5 mg total) by mouth daily. 30 tablet 0   levothyroxine (SYNTHROID) 100 MCG tablet TAKE ONE (1) TABLET EACH DAY 30 tablet 2  rosuvastatin (CRESTOR) 5 MG tablet Take 1 tablet (5 mg total) by mouth daily. 90 tablet 0   No facility-administered medications prior to visit.     Office Visit from 10/17/2019 in [facility]  PHQ-9 Total Score 14    PHQ-9 score is 11/23/19-10  GAD 7 : Generalized Anxiety Score 07/19/2018  Nervous, Anxious, on Edge 2  Control/stop worrying 3  Worry too much - different things 2  Trouble relaxing 2  Restless 1  Easily annoyed or irritable 3  Afraid - awful might happen 3  Total GAD 7 Score 16   GAD-7 score 11/23/19  7  Allergies  Allergen Reactions   Fenofibrate Micronized     Sexual dysfunc    ROS Review of Systems  Psychiatric/Behavioral: Negative for self-injury, sleep disturbance and suicidal ideas.  All  other systems reviewed and are negative.     Objective:    Physical Exam Vitals reviewed.  Constitutional:      Appearance: Normal appearance.  HENT:     Head: Normocephalic.  Cardiovascular:     Rate and Rhythm: Normal rate and regular rhythm.     Pulses: Normal pulses.     Heart sounds: Normal heart sounds.  Pulmonary:     Effort: Pulmonary effort is normal.     Breath sounds: Normal breath sounds.  Abdominal:     General: Bowel sounds are normal.  Musculoskeletal:        General: Normal range of motion.  Skin:    General: Skin is warm.  Neurological:     Mental Status: He is alert and oriented to person, place, and time.  Psychiatric:     Comments: Depression/anxiety     BP 118/77    Pulse 83    Temp 97.7 F (36.5 C)    Resp 20    Ht 5\' 10"  (1.778 m)    Wt 251 lb (113.9 kg)    SpO2 96%    BMI 36.01 kg/m  Wt Readings from Last 3 Encounters:  11/23/19 251 lb (113.9 kg)  10/17/19 243 lb (110.2 kg)  06/19/19 250 lb (113.4 kg)     Health Maintenance Due  Topic Date Due   Hepatitis C Screening  Never done   HIV Screening  Never done   INFLUENZA VACCINE  10/15/2019      Lab Results  Component Value Date   TSH 4.810 (H) 10/17/2019   Lab Results  Component Value Date   WBC 8.4 10/17/2019   HGB 16.0 10/17/2019   HCT 48.0 10/17/2019   MCV 89 10/17/2019   PLT 345 10/17/2019   Lab Results  Component Value Date   NA 139 07/19/2018   K 4.1 07/19/2018   CO2 23 07/19/2018   GLUCOSE 89 07/19/2018   BUN 14 07/19/2018   CREATININE 1.02 07/19/2018   BILITOT 0.4 07/19/2018   ALKPHOS 69 07/19/2018   AST 22 07/19/2018   ALT 37 07/19/2018   PROT 7.2 07/19/2018   ALBUMIN 4.7 07/19/2018   CALCIUM 9.7 07/19/2018   Lab Results  Component Value Date   CHOL 211 (H) 10/17/2019   Lab Results  Component Value Date   HDL 27 (L) 10/17/2019   Lab Results  Component Value Date   LDLCALC 97 10/17/2019   Lab Results  Component Value Date   TRIG 518 (H)  10/17/2019   Lab Results  Component Value Date   CHOLHDL 7.8 (H) 10/17/2019   No results found for: HGBA1C  Assessment & Plan:   Problem List Items Addressed This Visit      Other   Depression, major, single episode, mild (HCC) - Primary    Depression is well managed with current medication.  Patient is currently on 5 mg Lexapro daily.  No changes to medication dose.  Completed PHQ-9 score is is 10 today, GAD-7 score is a 7 today.  Patient is progressively improving.  Continue to provide education on living with depression.  Advised patient to continue medication as prescribed.  Follow-up in 3 months.           Follow-up: Return in about 2 months (around 01/23/2020) for f/u cholesterol/thyroid.    Daryll Drown, NP

## 2019-12-13 ENCOUNTER — Other Ambulatory Visit: Payer: Self-pay

## 2019-12-13 MED ORDER — ESCITALOPRAM OXALATE 5 MG PO TABS: 5.0000 mg | ORAL_TABLET | Freq: Every day | ORAL | 2 refills | Status: DC

## 2020-01-23 ENCOUNTER — Ambulatory Visit: Payer: 59 | Admitting: Family

## 2020-01-25 ENCOUNTER — Encounter: Payer: Self-pay | Admitting: Family

## 2020-02-29 ENCOUNTER — Ambulatory Visit: Payer: 59 | Admitting: Family

## 2020-03-27 ENCOUNTER — Ambulatory Visit: Payer: 59 | Admitting: Family

## 2020-03-30 ENCOUNTER — Other Ambulatory Visit: Payer: Self-pay | Admitting: Family Medicine

## 2020-03-30 DIAGNOSIS — E039 Hypothyroidism, unspecified: Secondary | ICD-10-CM

## 2020-04-12 ENCOUNTER — Other Ambulatory Visit: Payer: Self-pay

## 2020-04-12 ENCOUNTER — Ambulatory Visit: Payer: 59 | Admitting: Family

## 2020-04-12 ENCOUNTER — Encounter: Payer: Self-pay | Admitting: Family

## 2020-04-12 VITALS — BP 117/73 | HR 77 | Temp 98.6°F | Ht 70.0 in | Wt 256.6 lb

## 2020-04-12 DIAGNOSIS — Z1159 Encounter for screening for other viral diseases: Secondary | ICD-10-CM | POA: Diagnosis not present

## 2020-04-12 DIAGNOSIS — E785 Hyperlipidemia, unspecified: Secondary | ICD-10-CM

## 2020-04-12 DIAGNOSIS — F320 Major depressive disorder, single episode, mild: Secondary | ICD-10-CM | POA: Diagnosis not present

## 2020-04-12 DIAGNOSIS — F411 Generalized anxiety disorder: Secondary | ICD-10-CM | POA: Diagnosis not present

## 2020-04-12 DIAGNOSIS — Z114 Encounter for screening for human immunodeficiency virus [HIV]: Secondary | ICD-10-CM

## 2020-04-12 DIAGNOSIS — E039 Hypothyroidism, unspecified: Secondary | ICD-10-CM | POA: Diagnosis not present

## 2020-04-12 DIAGNOSIS — H6522 Chronic serous otitis media, left ear: Secondary | ICD-10-CM

## 2020-04-12 MED ORDER — ESCITALOPRAM OXALATE 10 MG PO TABS: 10.0000 mg | ORAL_TABLET | Freq: Every day | ORAL | 3 refills | Status: DC

## 2020-04-12 NOTE — Patient Instructions (Signed)
Hypothyroidism  Hypothyroidism is when the thyroid gland does not make enough of certain hormones (it is underactive). The thyroid gland is a small gland located in the lower front part of the neck, just in front of the windpipe (trachea). This gland makes hormones that help control how the body uses food for energy (metabolism) as well as how the heart and brain function. These hormones also play a role in keeping your bones strong. When the thyroid is underactive, it produces too little of the hormones thyroxine (T4) and triiodothyronine (T3). What are the causes? This condition may be caused by:  Hashimoto's disease. This is a disease in which the body's disease-fighting system (immune system) attacks the thyroid gland. This is the most common cause.  Viral infections.  Pregnancy.  Certain medicines.  Birth defects.  Past radiation treatments to the head or neck for cancer.  Past treatment with radioactive iodine.  Past exposure to radiation in the environment.  Past surgical removal of part or all of the thyroid.  Problems with a gland in the center of the brain (pituitary gland).  Lack of enough iodine in the diet. What increases the risk? You are more likely to develop this condition if:  You are male.  You have a family history of thyroid conditions.  You use a medicine called lithium.  You take medicines that affect the immune system (immunosuppressants). What are the signs or symptoms? Symptoms of this condition include:  Feeling as though you have no energy (lethargy).  Not being able to tolerate cold.  Weight gain that is not explained by a change in diet or exercise habits.  Lack of appetite.  Dry skin.  Coarse hair.  Menstrual irregularity.  Slowing of thought processes.  Constipation.  Sadness or depression. How is this diagnosed? This condition may be diagnosed based on:  Your symptoms, your medical history, and a physical exam.  Blood  tests. You may also have imaging tests, such as an ultrasound or MRI. How is this treated? This condition is treated with medicine that replaces the thyroid hormones that your body does not make. After you begin treatment, it may take several weeks for symptoms to go away. Follow these instructions at home:  Take over-the-counter and prescription medicines only as told by your health care provider.  If you start taking any new medicines, tell your health care provider.  Keep all follow-up visits as told by your health care provider. This is important. ? As your condition improves, your dosage of thyroid hormone medicine may change. ? You will need to have blood tests regularly so that your health care provider can monitor your condition. Contact a health care provider if:  Your symptoms do not get better with treatment.  You are taking thyroid hormone replacement medicine and you: ? Sweat a lot. ? Have tremors. ? Feel anxious. ? Lose weight rapidly. ? Cannot tolerate heat. ? Have emotional swings. ? Have diarrhea. ? Feel weak. Get help right away if you have:  Chest pain.  An irregular heartbeat.  A rapid heartbeat.  Difficulty breathing. Summary  Hypothyroidism is when the thyroid gland does not make enough of certain hormones (it is underactive).  When the thyroid is underactive, it produces too little of the hormones thyroxine (T4) and triiodothyronine (T3).  The most common cause is Hashimoto's disease, a disease in which the body's disease-fighting system (immune system) attacks the thyroid gland. The condition can also be caused by viral infections, medicine, pregnancy, or   past radiation treatment to the head or neck.  Symptoms may include weight gain, dry skin, constipation, feeling as though you do not have energy, and not being able to tolerate cold.  This condition is treated with medicine to replace the thyroid hormones that your body does not make. This  information is not intended to replace advice given to you by your health care provider. Make sure you discuss any questions you have with your health care provider. Document Revised: 12/01/2019 Document Reviewed: 11/16/2019 Elsevier Patient Education  2021 Elsevier Inc.  

## 2020-04-12 NOTE — Progress Notes (Signed)
Subjective:    Patient ID: Jordan Barnett, male    DOB: 10/15/1990, 30 y.o.   MRN: [id number]  Chief Complaint  Patient presents with  . Medical Management of Chronic Issues   Pt presents to the office today for chronic follow up. He is followed by ENT as needed for chronic otitis media and has bilateral tubes.  Thyroid Problem Presents for follow-up visit. Symptoms include anxiety and diarrhea. Patient reports no constipation, dry skin or fatigue. The symptoms have been stable. His past medical history is significant for hyperlipidemia.  Hyperlipidemia This is a chronic problem. The current episode started more than 1 year ago. Exacerbating diseases include obesity. Current antihyperlipidemic treatment includes statins. The current treatment provides moderate improvement of lipids. Risk factors for coronary artery disease include dyslipidemia, male sex and a sedentary lifestyle.  Anxiety Presents for follow-up visit. Symptoms include excessive worry, irritability, nervous/anxious behavior and restlessness. Symptoms occur occasionally. The severity of symptoms is moderate. The quality of sleep is good.    Depression        This is a chronic problem.  The current episode started more than 1 year ago.   The onset quality is gradual.   The problem occurs intermittently.  Associated symptoms include irritable, restlessness and sad.  Associated symptoms include no fatigue, no helplessness and no hopelessness.  Past treatments include SSRIs - Selective serotonin reuptake inhibitors.  Compliance with treatment is good.  Past medical history includes thyroid problem and anxiety.       Review of Systems  Constitutional: Positive for irritability. Negative for fatigue.  Gastrointestinal: Positive for diarrhea. Negative for constipation.  Psychiatric/Behavioral: Positive for depression. The patient is nervous/anxious.   All other systems reviewed and are negative.      Objective:   Physical  Exam Vitals reviewed.  Constitutional:      General: He is irritable. He is not in acute distress.    Appearance: He is well-developed and well-nourished.  HENT:     Head: Normocephalic.     Right Ear: Tympanic membrane normal. No PE tube (in canal of ear, attemped to removed, but unable. ).     Left Ear: Tympanic membrane normal. A PE tube is present.     Mouth/Throat:     Mouth: Oropharynx is clear and moist.  Eyes:     General:        Right eye: No discharge.        Left eye: No discharge.     Pupils: Pupils are equal, round, and reactive to light.  Neck:     Thyroid: No thyromegaly.  Cardiovascular:     Rate and Rhythm: Normal rate and regular rhythm.     Pulses: Intact distal pulses.     Heart sounds: Normal heart sounds. No murmur heard.   Pulmonary:     Effort: Pulmonary effort is normal. No respiratory distress.     Breath sounds: Normal breath sounds. No wheezing.  Abdominal:     General: Bowel sounds are normal. There is no distension.     Palpations: Abdomen is soft.     Tenderness: There is no abdominal tenderness.  Musculoskeletal:        General: No tenderness or edema. Normal range of motion.     Cervical back: Normal range of motion and neck supple.  Skin:    General: Skin is warm and dry.     Findings: No erythema or rash.  Neurological:     Mental Status: He  is alert and oriented to person, place, and time.     Cranial Nerves: No cranial nerve deficit.     Deep Tendon Reflexes: Reflexes are normal and symmetric.  Psychiatric:        Mood and Affect: Mood and affect normal.        Behavior: Behavior normal.        Thought Content: Thought content normal.        Judgment: Judgment normal.       BP 117/73   Pulse 77   Temp 98.6 F (37 C) (Temporal)   Ht '5\' 10"'  (1.778 m)   Wt 256 lb 9.6 oz (116.4 kg)   BMI 36.82 kg/m      Assessment & Plan:  Jordan Barnett comes in today with chief complaint of Medical Management of Chronic  Issues   Diagnosis and orders addressed:  1. Acquired hypothyroidism - CMP14+EGFR - CBC with Differential/Platelet - TSH  2. Depression, major, single episode, mild (HCC) Will increase Lexapro to 10 mg from 5 mg  Stress management discussed  - escitalopram (LEXAPRO) 10 MG tablet; Take 1 tablet (10 mg total) by mouth daily.  Dispense: 90 tablet; Refill: 3 - CMP14+EGFR - CBC with Differential/Platelet  3. GAD (generalized anxiety disorder) -Will increase Lexapro to 10 mg from 5 mg  Stress management  - escitalopram (LEXAPRO) 10 MG tablet; Take 1 tablet (10 mg total) by mouth daily.  Dispense: 90 tablet; Refill: 3 - CMP14+EGFR - CBC with Differential/Platelet  4. Need for hepatitis C screening test - CMP14+EGFR - CBC with Differential/Platelet - Hepatitis C antibody  5. Encounter for screening for HIV - CMP14+EGFR - CBC with Differential/Platelet - HIV Antibody (routine testing w rflx)  6. Hyperlipidemia, unspecified hyperlipidemia type - CMP14+EGFR - CBC with Differential/Platelet - Lipid panel  7. Left chronic serous otitis media Keep ENT    Labs pending Health Maintenance reviewed Diet and exercise encouraged  Follow up plan: 6 weeks for GAD and Depression    Evelina Dun, FNP

## 2020-04-13 LAB — CBC WITH DIFFERENTIAL/PLATELET
Basophils Absolute: 0.1 10*3/uL (ref 0.0–0.2)
Basos: 1 %
EOS (ABSOLUTE): 0.1 10*3/uL (ref 0.0–0.4)
Eos: 1 %
Hematocrit: 46.8 % (ref 37.5–51.0)
Hemoglobin: 15 g/dL (ref 13.0–17.7)
Immature Grans (Abs): 0 10*3/uL (ref 0.0–0.1)
Immature Granulocytes: 0 %
Lymphocytes Absolute: 2.5 10*3/uL (ref 0.7–3.1)
Lymphs: 35 %
MCH: 29.2 pg (ref 26.6–33.0)
MCHC: 32.1 g/dL (ref 31.5–35.7)
MCV: 91 fL (ref 79–97)
Monocytes Absolute: 0.5 10*3/uL (ref 0.1–0.9)
Monocytes: 7 %
Neutrophils Absolute: 4 10*3/uL (ref 1.4–7.0)
Neutrophils: 56 %
Platelets: 321 10*3/uL (ref 150–450)
RBC: 5.13 x10E6/uL (ref 4.14–5.80)
RDW: 12.7 % (ref 11.6–15.4)
WBC: 7.2 10*3/uL (ref 3.4–10.8)

## 2020-04-13 LAB — CMP14+EGFR
ALT: 43 IU/L (ref 0–44)
AST: 25 IU/L (ref 0–40)
Albumin/Globulin Ratio: 1.6 (ref 1.2–2.2)
Albumin: 4.2 g/dL (ref 4.1–5.2)
Alkaline Phosphatase: 76 IU/L (ref 44–121)
BUN/Creatinine Ratio: 13 (ref 9–20)
BUN: 12 mg/dL (ref 6–20)
Bilirubin Total: 0.2 mg/dL (ref 0.0–1.2)
CO2: 23 mmol/L (ref 20–29)
Calcium: 9.5 mg/dL (ref 8.7–10.2)
Chloride: 100 mmol/L (ref 96–106)
Creatinine, Ser: 0.91 mg/dL (ref 0.76–1.27)
GFR calc Af Amer: 131 mL/min/{1.73_m2} (ref >59)
GFR calc non Af Amer: 113 mL/min/{1.73_m2} (ref >59)
Globulin, Total: 2.6 g/dL (ref 1.5–4.5)
Glucose: 220 mg/dL — ABNORMAL HIGH (ref 65–99)
Potassium: 4.3 mmol/L (ref 3.5–5.2)
Sodium: 139 mmol/L (ref 134–144)
Total Protein: 6.8 g/dL (ref 6.0–8.5)

## 2020-04-13 LAB — LIPID PANEL
Chol/HDL Ratio: 7.8 ratio — ABNORMAL HIGH (ref 0.0–5.0)
Cholesterol, Total: 179 mg/dL (ref 100–199)
HDL: 23 mg/dL — ABNORMAL LOW (ref >39)
LDL Chol Calc (NIH): 75 mg/dL (ref 0–99)
Triglycerides: 508 mg/dL — ABNORMAL HIGH (ref 0–149)
VLDL Cholesterol Cal: 81 mg/dL — ABNORMAL HIGH (ref 5–40)

## 2020-04-13 LAB — TSH: TSH: 1.91 u[IU]/mL (ref 0.450–4.500)

## 2020-04-13 LAB — HEPATITIS C ANTIBODY: Hep C Virus Ab: 0.1 s/co ratio (ref 0.0–0.9)

## 2020-04-13 LAB — HIV ANTIBODY (ROUTINE TESTING W REFLEX): HIV Screen 4th Generation wRfx: NONREACTIVE

## 2020-04-16 LAB — SPECIMEN STATUS REPORT

## 2020-04-17 LAB — SPECIMEN STATUS REPORT

## 2020-04-17 LAB — HGB A1C W/O EAG: Hgb A1c MFr Bld: 7.2 % — ABNORMAL HIGH (ref 4.8–5.6)

## 2020-04-18 ENCOUNTER — Other Ambulatory Visit: Payer: Self-pay | Admitting: Family

## 2020-04-18 DIAGNOSIS — E1169 Type 2 diabetes mellitus with other specified complication: Secondary | ICD-10-CM

## 2020-04-18 DIAGNOSIS — E119 Type 2 diabetes mellitus without complications: Secondary | ICD-10-CM | POA: Insufficient documentation

## 2020-04-18 MED ORDER — BLOOD GLUCOSE METER KIT: PACK | 0 refills | Status: DC

## 2020-04-18 MED ORDER — METFORMIN HCL ER 500 MG PO TB24: 500.0000 mg | ORAL_TABLET | Freq: Every day | ORAL | 1 refills | Status: DC

## 2020-04-22 ENCOUNTER — Ambulatory Visit: Payer: 59 | Admitting: Family

## 2020-04-22 ENCOUNTER — Other Ambulatory Visit: Payer: Self-pay | Admitting: *Deleted

## 2020-04-22 MED ORDER — ROSUVASTATIN CALCIUM 5 MG PO TABS: 5.0000 mg | ORAL_TABLET | Freq: Every day | ORAL | 1 refills | Status: DC

## 2020-05-09 ENCOUNTER — Emergency Department (HOSPITAL_COMMUNITY): Payer: 59

## 2020-05-09 ENCOUNTER — Other Ambulatory Visit: Payer: Self-pay

## 2020-05-09 ENCOUNTER — Encounter (HOSPITAL_COMMUNITY): Payer: Self-pay

## 2020-05-09 ENCOUNTER — Ambulatory Visit (HOSPITAL_COMMUNITY)
Admission: EM | Admit: 2020-05-09 | Discharge: 2020-05-09 | Disposition: A | Discharge status: Home / Self Care | Payer: 59 | Attending: Emergency Medicine | Admitting: Emergency Medicine

## 2020-05-09 ENCOUNTER — Other Ambulatory Visit: Payer: Self-pay | Admitting: Urology

## 2020-05-09 ENCOUNTER — Emergency Department (HOSPITAL_COMMUNITY): Payer: 59 | Admitting: Certified Registered"

## 2020-05-09 ENCOUNTER — Encounter (HOSPITAL_COMMUNITY)
Admission: EM | Disposition: A | Discharge status: Home / Self Care | Payer: Self-pay | Source: Home / Self Care | Attending: Emergency Medicine

## 2020-05-09 DIAGNOSIS — Z79899 Other long term (current) drug therapy: Secondary | ICD-10-CM | POA: Diagnosis not present

## 2020-05-09 DIAGNOSIS — Z888 Allergy status to other drugs, medicaments and biological substances status: Secondary | ICD-10-CM | POA: Diagnosis not present

## 2020-05-09 DIAGNOSIS — N132 Hydronephrosis with renal and ureteral calculous obstruction: Secondary | ICD-10-CM | POA: Diagnosis not present

## 2020-05-09 DIAGNOSIS — N200 Calculus of kidney: Secondary | ICD-10-CM | POA: Diagnosis present

## 2020-05-09 DIAGNOSIS — Z7984 Long term (current) use of oral hypoglycemic drugs: Secondary | ICD-10-CM | POA: Diagnosis not present

## 2020-05-09 DIAGNOSIS — Z79890 Hormone replacement therapy (postmenopausal): Secondary | ICD-10-CM | POA: Insufficient documentation

## 2020-05-09 DIAGNOSIS — Z87891 Personal history of nicotine dependence: Secondary | ICD-10-CM | POA: Insufficient documentation

## 2020-05-09 DIAGNOSIS — Z20822 Contact with and (suspected) exposure to covid-19: Secondary | ICD-10-CM | POA: Diagnosis not present

## 2020-05-09 HISTORY — DX: Pure hypercholesterolemia, unspecified: E78.00

## 2020-05-09 HISTORY — DX: Type 2 diabetes mellitus without complications: E11.9

## 2020-05-09 HISTORY — DX: Depression, unspecified: F32.A

## 2020-05-09 HISTORY — PX: CYSTOSCOPY W/ URETERAL STENT PLACEMENT: SHX1429

## 2020-05-09 LAB — URINALYSIS, ROUTINE W REFLEX MICROSCOPIC
Bilirubin Urine: NEGATIVE
Glucose, UA: NEGATIVE mg/dL
Ketones, ur: 5 mg/dL — AB
Leukocytes,Ua: NEGATIVE
Nitrite: NEGATIVE
Protein, ur: 30 mg/dL — AB
RBC / HPF: >50 RBC/hpf — ABNORMAL HIGH (ref 0–5)
Specific Gravity, Urine: 1.028 (ref 1.005–1.030)
pH: 5 (ref 5.0–8.0)

## 2020-05-09 LAB — CBC WITH DIFFERENTIAL/PLATELET
Abs Immature Granulocytes: 0.03 10*3/uL (ref 0.00–0.07)
Basophils Absolute: 0.1 10*3/uL (ref 0.0–0.1)
Basophils Relative: 1 %
Eosinophils Absolute: 0.1 10*3/uL (ref 0.0–0.5)
Eosinophils Relative: 1 %
HCT: 43.7 % (ref 39.0–52.0)
Hemoglobin: 14.5 g/dL (ref 13.0–17.0)
Immature Granulocytes: 0 %
Lymphocytes Relative: 33 %
Lymphs Abs: 3.3 10*3/uL (ref 0.7–4.0)
MCH: 30 pg (ref 26.0–34.0)
MCHC: 33.2 g/dL (ref 30.0–36.0)
MCV: 90.5 fL (ref 80.0–100.0)
Monocytes Absolute: 0.8 10*3/uL (ref 0.1–1.0)
Monocytes Relative: 8 %
Neutro Abs: 5.8 10*3/uL (ref 1.7–7.7)
Neutrophils Relative %: 57 %
Platelets: 318 10*3/uL (ref 150–400)
RBC: 4.83 MIL/uL (ref 4.22–5.81)
RDW: 12.7 % (ref 11.5–15.5)
WBC: 10.1 10*3/uL (ref 4.0–10.5)
nRBC: 0 % (ref 0.0–0.2)

## 2020-05-09 LAB — COMPREHENSIVE METABOLIC PANEL
ALT: 36 U/L (ref 0–44)
AST: 22 U/L (ref 15–41)
Albumin: 4.5 g/dL (ref 3.5–5.0)
Alkaline Phosphatase: 56 U/L (ref 38–126)
Anion gap: 11 (ref 5–15)
BUN: 20 mg/dL (ref 6–20)
CO2: 25 mmol/L (ref 22–32)
Calcium: 9.9 mg/dL (ref 8.9–10.3)
Chloride: 105 mmol/L (ref 98–111)
Creatinine, Ser: 1.28 mg/dL — ABNORMAL HIGH (ref 0.61–1.24)
GFR, Estimated: >60 mL/min (ref >60)
Glucose, Bld: 123 mg/dL — ABNORMAL HIGH (ref 70–99)
Potassium: 3.7 mmol/L (ref 3.5–5.1)
Sodium: 141 mmol/L (ref 135–145)
Total Bilirubin: 0.6 mg/dL (ref 0.3–1.2)
Total Protein: 7.7 g/dL (ref 6.5–8.1)

## 2020-05-09 LAB — GLUCOSE, CAPILLARY: Glucose-Capillary: 153 mg/dL — ABNORMAL HIGH (ref 70–99)

## 2020-05-09 LAB — RESP PANEL BY RT-PCR (FLU A&B, COVID) ARPGX2
Influenza A by PCR: NEGATIVE
Influenza B by PCR: NEGATIVE
SARS Coronavirus 2 by RT PCR: NEGATIVE

## 2020-05-09 SURGERY — CYSTOSCOPY, WITH RETROGRADE PYELOGRAM AND URETERAL STENT INSERTION
Anesthesia: General | Laterality: Right

## 2020-05-09 MED ORDER — LEVOFLOXACIN 500 MG PO TABS: 500.0000 mg | ORAL_TABLET | Freq: Every day | ORAL | 0 refills | Status: AC

## 2020-05-09 MED ORDER — ONDANSETRON HCL 4 MG/2ML IJ SOLN
INTRAMUSCULAR | Status: DC | PRN
  Administered 2020-05-09: 4 mg via INTRAVENOUS

## 2020-05-09 MED ORDER — FENTANYL CITRATE (PF) 100 MCG/2ML IJ SOLN: 25.0000 ug | INTRAMUSCULAR | Status: DC | PRN

## 2020-05-09 MED ORDER — KETOROLAC TROMETHAMINE 60 MG/2ML IM SOLN
60.0000 mg | Freq: Once | INTRAMUSCULAR | Status: AC
  Administered 2020-05-09: 60 mg via INTRAMUSCULAR
  Filled 2020-05-09: qty 2

## 2020-05-09 MED ORDER — CEFAZOLIN (ANCEF) 1 G IV SOLR: 2.0000 g | INTRAVENOUS | Status: DC

## 2020-05-09 MED ORDER — IOHEXOL 300 MG/ML  SOLN
INTRAMUSCULAR | Status: DC | PRN
  Administered 2020-05-09: 5 mL

## 2020-05-09 MED ORDER — CEFAZOLIN SODIUM-DEXTROSE 2-4 GM/100ML-% IV SOLN
2.0000 g | INTRAVENOUS | Status: AC
  Administered 2020-05-09: 2 g via INTRAVENOUS
  Filled 2020-05-09 (×2): qty 100

## 2020-05-09 MED ORDER — TAMSULOSIN HCL 0.4 MG PO CAPS
0.4000 mg | ORAL_CAPSULE | Freq: Every day | ORAL | 0 refills | Status: DC
Start: 2020-05-09 — End: 2020-06-13

## 2020-05-09 MED ORDER — HYDROMORPHONE HCL 1 MG/ML IJ SOLN
1.0000 mg | Freq: Once | INTRAMUSCULAR | Status: AC
  Administered 2020-05-09: 1 mg via INTRAVENOUS
  Filled 2020-05-09: qty 1

## 2020-05-09 MED ORDER — LIDOCAINE 2% (20 MG/ML) 5 ML SYRINGE
INTRAMUSCULAR | Status: DC | PRN
  Administered 2020-05-09: 100 mg via INTRAVENOUS

## 2020-05-09 MED ORDER — DEXAMETHASONE SODIUM PHOSPHATE 10 MG/ML IJ SOLN
INTRAMUSCULAR | Status: DC | PRN
  Administered 2020-05-09: 4 mg via INTRAVENOUS

## 2020-05-09 MED ORDER — LACTATED RINGERS IV SOLN: INTRAVENOUS | Status: DC

## 2020-05-09 MED ORDER — LIDOCAINE HCL (PF) 2 % IJ SOLN
INTRAMUSCULAR | Status: AC
  Filled 2020-05-09: qty 5

## 2020-05-09 MED ORDER — ONDANSETRON HCL 4 MG/2ML IJ SOLN
INTRAMUSCULAR | Status: AC
  Filled 2020-05-09: qty 2

## 2020-05-09 MED ORDER — PROMETHAZINE HCL 25 MG/ML IJ SOLN: 6.2500 mg | INTRAMUSCULAR | Status: DC | PRN

## 2020-05-09 MED ORDER — MIDAZOLAM HCL 2 MG/2ML IJ SOLN
INTRAMUSCULAR | Status: AC
  Filled 2020-05-09: qty 2

## 2020-05-09 MED ORDER — FENTANYL CITRATE (PF) 100 MCG/2ML IJ SOLN
INTRAMUSCULAR | Status: AC
  Filled 2020-05-09: qty 2

## 2020-05-09 MED ORDER — DEXAMETHASONE SODIUM PHOSPHATE 10 MG/ML IJ SOLN
INTRAMUSCULAR | Status: AC
  Filled 2020-05-09: qty 1

## 2020-05-09 MED ORDER — PROPOFOL 10 MG/ML IV BOLUS
INTRAVENOUS | Status: DC | PRN
  Administered 2020-05-09: 200 mg via INTRAVENOUS

## 2020-05-09 MED ORDER — ACETAMINOPHEN 10 MG/ML IV SOLN: 1000.0000 mg | Freq: Once | INTRAVENOUS | Status: DC | PRN

## 2020-05-09 MED ORDER — FENTANYL CITRATE (PF) 100 MCG/2ML IJ SOLN
INTRAMUSCULAR | Status: DC | PRN
  Administered 2020-05-09: 50 ug via INTRAVENOUS

## 2020-05-09 MED ORDER — OXYCODONE-ACETAMINOPHEN 5-325 MG PO TABS: 1.0000 | ORAL_TABLET | ORAL | 0 refills | Status: DC | PRN

## 2020-05-09 MED ORDER — SODIUM CHLORIDE 0.9 % IR SOLN
Status: DC | PRN
  Administered 2020-05-09 (×2): 3000 mL

## 2020-05-09 MED ORDER — 0.9 % SODIUM CHLORIDE (POUR BTL) OPTIME
TOPICAL | Status: DC | PRN
  Administered 2020-05-09: 1000 mL

## 2020-05-09 SURGICAL SUPPLY — 14 items
BAG URO CATCHER STRL LF (MISCELLANEOUS) ×2 IMPLANT
BULB IRRIG PATHFIND (MISCELLANEOUS) ×2 IMPLANT
CATH INTERMIT  6FR 70CM (CATHETERS) ×2 IMPLANT
CATH URET 5FR 28IN OPEN ENDED (CATHETERS) ×2 IMPLANT
CLOTH BEACON ORANGE TIMEOUT ST (SAFETY) ×2 IMPLANT
GLOVE SURG ENC TEXT LTX SZ7.5 (GLOVE) ×2 IMPLANT
GOWN STRL REUS W/TWL LRG LVL3 (GOWN DISPOSABLE) ×4 IMPLANT
GUIDEWIRE STR DUAL SENSOR (WIRE) ×2 IMPLANT
KIT TURNOVER KIT A (KITS) ×2 IMPLANT
MANIFOLD NEPTUNE II (INSTRUMENTS) ×2 IMPLANT
PACK CYSTO (CUSTOM PROCEDURE TRAY) ×2 IMPLANT
SYR 20ML LL LF (SYRINGE) ×2 IMPLANT
TUBING CONNECTING 10 (TUBING) ×2 IMPLANT
TUBING UROLOGY SET (TUBING) ×2 IMPLANT

## 2020-05-09 NOTE — H&P (Signed)
H&P  Chief Complaint: Right flank pain  History of Present Illness: Jordan Barnett is a 30 y.o. year old white male seen today for evaluation of right-sided flank pain.  Patient has had some on and off right-sided back and flank pain with associated nausea for the last 3 days.  He was seen today with worsening pain.  During evaluation in the emergency room was found to have a 1.6 x 1.1 right UPJ stone with some hydronephrosis.  Pain has been unrelenting.  Denies any fever.  He will be admitted to undergo cystoscopy and insertion of right JJ stent  Past Medical History:  Diagnosis Date  . Diabetes mellitus without complication ([place])   . Sciatica   . Thyroid disease     Past Surgical History:  Procedure Laterality Date  . LAPAROSCOPIC APPENDECTOMY N/A 12/07/2012   Procedure: APPENDECTOMY LAPAROSCOPIC;  Surgeon: Gayland Curry, MD;  Location: [facility];  Service: General;  Laterality: N/A;    Home Medications:  No current facility-administered medications on file prior to encounter.   Current Outpatient Medications on File Prior to Encounter  Medication Sig Dispense Refill  . blood glucose meter kit and supplies Dispense based on patient and insurance preference. Use up to four times daily as directed. (FOR ICD-10 E10.9, E11.9). 1 each 0  . escitalopram (LEXAPRO) 10 MG tablet Take 1 tablet (10 mg total) by mouth daily. 90 tablet 3  . ibuprofen (ADVIL) 200 MG tablet Take 800 mg by mouth every 6 (six) hours as needed for fever, headache or mild pain.    Marland Kitchen levothyroxine (SYNTHROID) 100 MCG tablet TAKE ONE (1) TABLET EACH DAY (Patient taking differently: Take 100 mcg by mouth daily before breakfast.) 30 tablet 0  . metFORMIN (GLUCOPHAGE XR) 500 MG 24 hr tablet Take 1 tablet (500 mg total) by mouth daily with breakfast. 90 tablet 1  . rosuvastatin (CRESTOR) 5 MG tablet Take 1 tablet (5 mg total) by mouth daily. 90 tablet 1     Allergies:  Allergies  Allergen Reactions  . Fenofibrate Micronized      Sexual dysfunc    Family History  Problem Relation Age of Onset  . Hypertension Mother   . Diabetes Father     Social History:  reports that he quit smoking about 7 years ago. He has a 10.00 pack-year smoking history. He has quit using smokeless tobacco. He reports that he does not drink alcohol and does not use drugs.  ROS: A complete review of systems was performed.  All systems are negative except for pertinent findings as noted.  Physical Exam:  Vital signs in last 24 hours: Temp:  [98.7 F (37.1 C)] 98.7 F (37.1 C) (02/24 0703) Pulse Rate:  [62-90] 63 (02/24 1105) Resp:  [15-20] 18 (02/24 1105) BP: (136-154)/(77-113) 137/78 (02/24 1105) SpO2:  [92 %-99 %] 95 % (02/24 1105) Weight:  [111.1 kg] 111.1 kg (02/24 0752) Constitutional:  Alert and oriented, No acute distress Cardiovascular: Regular rate and rhythm, No JVD Respiratory: Normal respiratory effort, Lungs clear bilaterally GI: Abdomen is soft, nontender, nondistended, no abdominal masses GU: Right CVA tenderness Lymphatic: No lymphadenopathy Neurologic: Grossly intact, no focal deficits Psychiatric: Normal mood and affect   Laboratory Data:  Recent Labs    05/09/20 0726  WBC 10.1  HGB 14.5  HCT 43.7  PLT 318    Recent Labs    05/09/20 0726  NA 141  K 3.7  CL 105  GLUCOSE 123*  BUN 20  CALCIUM 9.9  CREATININE 1.28*     Results for orders placed or performed during the hospital encounter of 05/09/20 (from the past 24 hour(s))  Comprehensive metabolic panel     Status: Abnormal   Collection Time: 05/09/20  7:26 AM  Result Value Ref Range   Sodium 141 135 - 145 mmol/L   Potassium 3.7 3.5 - 5.1 mmol/L   Chloride 105 98 - 111 mmol/L   CO2 25 22 - 32 mmol/L   Glucose, Bld 123 (H) 70 - 99 mg/dL   BUN 20 6 - 20 mg/dL   Creatinine, Ser 1.28 (H) 0.61 - 1.24 mg/dL   Calcium 9.9 8.9 - 10.3 mg/dL   Total Protein 7.7 6.5 - 8.1 g/dL   Albumin 4.5 3.5 - 5.0 g/dL   AST 22 15 - 41 U/L   ALT 36 0 -  44 U/L   Alkaline Phosphatase 56 38 - 126 U/L   Total Bilirubin 0.6 0.3 - 1.2 mg/dL   GFR, Estimated >60 >60 mL/min   Anion gap 11 5 - 15  CBC with Differential     Status: None   Collection Time: 05/09/20  7:26 AM  Result Value Ref Range   WBC 10.1 4.0 - 10.5 K/uL   RBC 4.83 4.22 - 5.81 MIL/uL   Hemoglobin 14.5 13.0 - 17.0 g/dL   HCT 43.7 39.0 - 52.0 %   MCV 90.5 80.0 - 100.0 fL   MCH 30.0 26.0 - 34.0 pg   MCHC 33.2 30.0 - 36.0 g/dL   RDW 12.7 11.5 - 15.5 %   Platelets 318 150 - 400 K/uL   nRBC 0.0 0.0 - 0.2 %   Neutrophils Relative % 57 %   Neutro Abs 5.8 1.7 - 7.7 K/uL   Lymphocytes Relative 33 %   Lymphs Abs 3.3 0.7 - 4.0 K/uL   Monocytes Relative 8 %   Monocytes Absolute 0.8 0.1 - 1.0 K/uL   Eosinophils Relative 1 %   Eosinophils Absolute 0.1 0.0 - 0.5 K/uL   Basophils Relative 1 %   Basophils Absolute 0.1 0.0 - 0.1 K/uL   Immature Granulocytes 0 %   Abs Immature Granulocytes 0.03 0.00 - 0.07 K/uL  Urinalysis, Routine w reflex microscopic Urine, Clean Catch     Status: Abnormal   Collection Time: 05/09/20  7:28 AM  Result Value Ref Range   Color, Urine YELLOW YELLOW   APPearance CLEAR CLEAR   Specific Gravity, Urine 1.028 1.005 - 1.030   pH 5.0 5.0 - 8.0   Glucose, UA NEGATIVE NEGATIVE mg/dL   Hgb urine dipstick LARGE (A) NEGATIVE   Bilirubin Urine NEGATIVE NEGATIVE   Ketones, ur 5 (A) NEGATIVE mg/dL   Protein, ur 30 (A) NEGATIVE mg/dL   Nitrite NEGATIVE NEGATIVE   Leukocytes,Ua NEGATIVE NEGATIVE   RBC / HPF >50 (H) 0 - 5 RBC/hpf   WBC, UA 11-20 0 - 5 WBC/hpf   Bacteria, UA RARE (A) NONE SEEN   Mucus PRESENT    Hyaline Casts, UA PRESENT    Ca Oxalate Crys, UA PRESENT   Resp Panel by RT-PCR (Flu A&B, Covid) Nasopharyngeal Swab     Status: None   Collection Time: 05/09/20  9:17 AM   Specimen: Nasopharyngeal Swab; Nasopharyngeal(NP) swabs in vial transport medium  Result Value Ref Range   SARS Coronavirus 2 by RT PCR NEGATIVE NEGATIVE   Influenza A by PCR  NEGATIVE NEGATIVE   Influenza B by PCR NEGATIVE NEGATIVE   Recent Results (from the past  240 hour(s))  Resp Panel by RT-PCR (Flu A&B, Covid) Nasopharyngeal Swab     Status: None   Collection Time: 05/09/20  9:17 AM   Specimen: Nasopharyngeal Swab; Nasopharyngeal(NP) swabs in vial transport medium  Result Value Ref Range Status   SARS Coronavirus 2 by RT PCR NEGATIVE NEGATIVE Final    Comment: (NOTE) SARS-CoV-2 target nucleic acids are NOT DETECTED.  The SARS-CoV-2 RNA is generally detectable in upper respiratory specimens during the acute phase of infection. The lowest concentration of SARS-CoV-2 viral copies this assay can detect is 138 copies/mL. A negative result does not preclude SARS-Cov-2 infection and should not be used as the sole basis for treatment or other patient management decisions. A negative result may occur with  improper specimen collection/handling, submission of specimen other than nasopharyngeal swab, presence of viral mutation(s) within the areas targeted by this assay, and inadequate number of viral copies(<138 copies/mL). A negative result must be combined with clinical observations, patient history, and epidemiological information. The expected result is Negative.  Fact Sheet for Patients:  EntrepreneurPulse.com.au  Fact Sheet for Healthcare Providers:  IncredibleEmployment.be  This test is no t yet approved or cleared by the [place] FDA and  has been authorized for detection and/or diagnosis of SARS-CoV-2 by FDA under an Emergency Use Authorization (EUA). This EUA will remain  in effect (meaning this test can be used) for the duration of the COVID-19 declaration under Section 564(b)(1) of the Act, 21 U.S.C.section 360bbb-3(b)(1), unless the authorization is terminated  or revoked sooner.       Influenza A by PCR NEGATIVE NEGATIVE Final   Influenza B by PCR NEGATIVE NEGATIVE Final    Comment: (NOTE) The  Xpert Xpress SARS-CoV-2/FLU/RSV plus assay is intended as an aid in the diagnosis of influenza from Nasopharyngeal swab specimens and should not be used as a sole basis for treatment. Nasal washings and aspirates are unacceptable for Xpert Xpress SARS-CoV-2/FLU/RSV testing.  Fact Sheet for Patients: EntrepreneurPulse.com.au  Fact Sheet for Healthcare Providers: IncredibleEmployment.be  This test is not yet approved or cleared by the [place] FDA and has been authorized for detection and/or diagnosis of SARS-CoV-2 by FDA under an Emergency Use Authorization (EUA). This EUA will remain in effect (meaning this test can be used) for the duration of the COVID-19 declaration under Section 564(b)(1) of the Act, 21 U.S.C. section 360bbb-3(b)(1), unless the authorization is terminated or revoked.  Performed at [facility], [place] [address], [place], [place] [address]     Renal Function: Recent Labs    05/09/20 2637  CREATININE 1.28*   Estimated Creatinine Clearance: 106.2 mL/min (A) (by C-G formula based on SCr of 1.28 mg/dL (H)).  Radiologic Imaging: CT Renal Stone Study  Result Date: 05/09/2020 CLINICAL DATA:  Right flank pain EXAM: CT ABDOMEN AND PELVIS WITHOUT CONTRAST TECHNIQUE: Multidetector CT imaging of the abdomen and pelvis was performed following the standard protocol without IV contrast. COMPARISON:  December 07, 2012 FINDINGS: Lower chest: Lung bases are clear. Hepatobiliary: There is hepatic steatosis. No focal liver lesions are appreciable on this noncontrast enhanced study. Gallbladder wall is not appreciably thickened. There is no biliary duct dilatation. Pancreas: There is no pancreatic mass or inflammatory focus. Spleen: No splenic lesions are evident. A small splenule is noted anterior to the spleen. Adrenals/Urinary Tract: Adrenals bilaterally appear normal. There is no appreciable renal mass on either side.  There is moderate hydronephrosis on the right. There is no hydronephrosis on the left. There is a  1 mm calculus in the lower pole of the left kidney. There is a calculus at the right ureteropelvic junction measuring 1.6 x 1.1 cm. No other ureteral calculi are evident. Urinary bladder is midline with wall thickness within normal limits. Stomach/Bowel: There is no appreciable bowel wall or mesenteric thickening. Terminal ileum appears normal. There is no evident bowel obstruction. There is no free air or portal venous air. Vascular/Lymphatic: There is no abdominal aortic aneurysm. No vascular lesions are evident on this noncontrast enhanced study. There is no evident adenopathy in the abdomen or pelvis. Reproductive: Prostate and seminal vesicles are normal in size and configuration. Other: Appendix absent. There is no periappendiceal region inflammatory change. No evident abscess or ascites in the abdomen or pelvis. There is mild fat in the umbilicus. Musculoskeletal: There are no blastic or lytic bone lesions. There is no intramuscular lesion. IMPRESSION: 1. There is a calculus at the right ureteropelvic junction measuring 1.6 x 1.1 cm causing moderate hydronephrosis on the right. 2.  1 mm nonobstructing calculus lower pole left kidney. 3. No bowel wall thickening or bowel obstruction. No abscess in the abdomen or pelvis. Appendix absent. 4.  Hepatic steatosis. Electronically Signed   By: Lowella Grip III M.D.   On: 05/09/2020 08:01    Impression/Assessment:  Right renal pelvis/UPJ stone with significant renal colic  Plan:  Discussed treatment option with the patient.  Recommended cystoscopy and insertion of urgent right JJ stent.  Likely will undergo ESL of stone after stent placement in the near future.  Patient consents to cystoscopy and insertion of right JJ stent  Remi Haggard 05/09/2020, 11:10 AM  Jamal Collin Giah Fickett,MD

## 2020-05-09 NOTE — Anesthesia Procedure Notes (Signed)
Procedure Name: LMA Insertion Performed by: Jahlen Bollman H, CRNA Pre-anesthesia Checklist: Patient identified, Emergency Drugs available, Suction available and Patient being monitored Patient Re-evaluated:Patient Re-evaluated prior to induction Oxygen Delivery Method: Circle System Utilized Preoxygenation: Pre-oxygenation with 100% oxygen Induction Type: IV induction Ventilation: Mask ventilation without difficulty LMA: LMA inserted LMA Size: 5.0 Number of attempts: 1 Airway Equipment and Method: Bite block Placement Confirmation: positive ETCO2 Tube secured with: Tape Dental Injury: Teeth and Oropharynx as per pre-operative assessment        

## 2020-05-09 NOTE — Op Note (Signed)
Preoperative diagnosis:  1.  Right renal pelvic stone  Postoperative diagnosis: 1.  Same  Procedure(s): 1.  Cystoscopy, right retrograde pyelogram with intraoperative interpretation, insertion right JJ stent  Surgeon: Dr. Karoline Caldwell  Anesthesia: General  Complications: None  EBL: Minimal  Specimens: None  Disposition of specimens: Not applicable  Intraoperative findings: 1.5 by approximate 2 cm right renal pelvic stone.  Able to pass 6 [place] by 26 cm Percuflex plus soft Contour stent to upper pole calyx.  Some bloody urine after passage of stent.  Culture sent  Indication: 30 year old white male presented with acute onset right-sided flank pain found to have large right renal pelvic stone at the UPJ with obstruction.  Here for cystoscopy and stent placement.  Description of procedure:  After obtaining for consent for the patient is taken the major cystoscopy suite placed under general anesthesia.  Placed in dorsolithotomy position genitalia prepped and draped in usual sterile fashion.  Proper pause and timeout was performed first site of procedure.  21 French cystoscope was then advanced into the bladder under direct vision without difficulty.  Bladder appeared grossly normal.  There is minimal reflux coming from the right ureteral orifice.  Right ureteral orifice was cannulated with a 5 French open tip catheter and retrograde pyelogram confirmed no obvious ureteral filling defects there is a large filling defect in the renal pelvis consistent with a stone seen on preoperative CT scan.  I advanced the open tip catheter up to the level just at the UPJ and then advanced a sensor wire around the stone to the upper pole calyx.  Open tip catheter was advanced to this area and there was hydronephrotic drip noted.  Urine was collected and sent for culture from this.  Open tip catheter was removed.  A 6 French by 26 cm Percuflex plus soft Contour stent was then placed leaving proximal coil  in the upper pole calyx and a distal coil in the bladder.  There was hydronephrotic flow of urine through and around the stent noted with some bloody urine.  Bladder was emptied procedure was terminated.  He was awakened from anesthesia and taken back to the recovery room in stable condition.  No immediate complication from the procedure.  Bladder.  There was flow some bloody urine through and around the stent noted.

## 2020-05-09 NOTE — Transfer of Care (Signed)
Immediate Anesthesia Transfer of Care Note  Patient: Jordan Barnett  Procedure(s) Performed: CYSTOSCOPY WITH RETROGRADE PYELOGRAM/URETERAL STENT PLACEMENT (Right )  Patient Location: PACU  Anesthesia Type:General  Level of Consciousness: awake  Airway & Oxygen Therapy: Patient Spontanous Breathing and Patient connected to face mask oxygen  Post-op Assessment: Report given to RN and Post -op Vital signs reviewed and stable  Post vital signs: Reviewed and stable  Last Vitals:  Vitals Value Taken Time  BP 160/98 05/09/20 1826  Temp    Pulse 56 05/09/20 1830  Resp 16 05/09/20 1830  SpO2 100 % 05/09/20 1830  Vitals shown include unvalidated device data.  Last Pain:  Vitals:   05/09/20 1427  TempSrc: Oral  PainSc: 0-No pain         Complications: No complications documented.

## 2020-05-09 NOTE — ED Triage Notes (Signed)
Pt complains of right flank pain that radiates to his back since about 4am, he states that he's had the pain before but it's gone away and he was never seen by a doctor Pt also states that he has vomited a few times

## 2020-05-09 NOTE — ED Notes (Addendum)
Assumed care of this patient. Vitals taken. A&Ox4. Pt reports right flank pain possible kidney stone. Denies nausea/vomiting. Respirations regular/unlabored. Connected to BP and pulse ox. Stretcher low wheels locked, call bell within reach. PA at bedside.

## 2020-05-09 NOTE — Anesthesia Postprocedure Evaluation (Signed)
Anesthesia Post Note  Patient: Navon Kotowski  Procedure(s) Performed: CYSTOSCOPY WITH RETROGRADE PYELOGRAM/URETERAL STENT PLACEMENT (Right )     Patient location during evaluation: PACU Anesthesia Type: General Level of consciousness: awake and alert Pain management: pain level controlled Vital Signs Assessment: post-procedure vital signs reviewed and stable Respiratory status: spontaneous breathing, nonlabored ventilation, respiratory function stable and patient connected to nasal cannula oxygen Cardiovascular status: blood pressure returned to baseline and stable Postop Assessment: no apparent nausea or vomiting Anesthetic complications: no   No complications documented.  Last Vitals:  Vitals:   05/09/20 1900 05/09/20 2000  BP: (!) 168/91 (!) 140/95  Pulse: 64 71  Resp: 16 15  Temp:  36.6 C  SpO2: 96% 94%    Last Pain:  Vitals:   05/09/20 2000  TempSrc:   PainSc: 0-No pain                 Trevor Iha

## 2020-05-09 NOTE — ED Provider Notes (Signed)
[facility] DEPT Provider Note   CSN: [id number] Arrival date & time: 05/09/20  9357     History No chief complaint on file.   Jordan Barnett is a 30 y.o. male.  HPI Patient is a 30 year old male with a medical history as noted below.  Patient states last night he began experiencing waxing and waning right flank pain.  He states his pain would come in waves.  Over the past couple of hours it became constant.  Reports associated difficulty urinating.  No dysuria or hematuria.  Now states his pain is predominantly in the abdominal region.  No nausea or vomiting.  He states he has had similar bouts of pain in the past but has never been evaluated for them.  No chest pain or shortness of breath.  No other somatic complaints at this time.  Patient drank a small amount of water about 4 hours ago.  Denies any significant p.o. intake since last night around 10 PM.    Past Medical History:  Diagnosis Date  . Diabetes mellitus without complication ([place])   . Sciatica   . Thyroid disease     Patient Active Problem List   Diagnosis Date Noted  . Diabetes mellitus ([place]) 04/18/2020  . Depression, major, single episode, mild ([place]) 07/19/2018  . GAD (generalized anxiety disorder) 07/19/2018  . Hypersomnolence 05/22/2016  . Fatigue 05/22/2016  . Hypothyroidism 05/22/2016  . Hyperlipidemia 05/22/2016  . Snoring 05/22/2016  . Left chronic serous otitis media 12/24/2015  . Eustachian tube dysfunction, left 12/04/2015  . Allergic rhinitis 12/04/2015  . S/P laparoscopic appendectomy 12/07/2012    Past Surgical History:  Procedure Laterality Date  . LAPAROSCOPIC APPENDECTOMY N/A 12/07/2012   Procedure: APPENDECTOMY LAPAROSCOPIC;  Surgeon: Gayland Curry, MD;  Location: [facility] OR;  Service: General;  Laterality: N/A;       Family History  Problem Relation Age of Onset  . Hypertension Mother   . Diabetes Father     Social History   Tobacco Use  . Smoking  status: Former Smoker    Packs/day: 1.00    Years: 10.00    Pack years: 10.00    Quit date: 03/16/2013    Years since quitting: 7.1  . Smokeless tobacco: Former Network engineer  . Vaping Use: Never used  Substance Use Topics  . Alcohol use: No  . Drug use: No    Home Medications Prior to Admission medications   Medication Sig Start Date End Date Taking? Authorizing Provider  blood glucose meter kit and supplies Dispense based on patient and insurance preference. Use up to four times daily as directed. (FOR ICD-10 E10.9, E11.9). 04/18/20   Evelina Dun A, FNP  escitalopram (LEXAPRO) 10 MG tablet Take 1 tablet (10 mg total) by mouth daily. 04/12/20  Yes Hawks, Christy A, FNP  ibuprofen (ADVIL) 200 MG tablet Take 800 mg by mouth every 6 (six) hours as needed for fever, headache or mild pain.   Yes [provider]  levothyroxine (SYNTHROID) 100 MCG tablet TAKE ONE (1) TABLET EACH DAY Patient taking differently: Take 100 mcg by mouth daily before breakfast. 04/01/20  Yes Hendricks Limes F, FNP  metFORMIN (GLUCOPHAGE XR) 500 MG 24 hr tablet Take 1 tablet (500 mg total) by mouth daily with breakfast. 04/18/20 04/18/21 Yes Hawks, Christy A, FNP  rosuvastatin (CRESTOR) 5 MG tablet Take 1 tablet (5 mg total) by mouth daily. 04/22/20  Yes Sharion Balloon, FNP    Allergies  Fenofibrate micronized  Review of Systems   Review of Systems  All other systems reviewed and are negative. Ten systems reviewed and are negative for acute change, except as noted in the HPI.   Physical Exam Updated Vital Signs BP 137/78   Pulse 63   Temp 98.7 F (37.1 C) (Oral)   Resp 18   Ht 5' 10" (1.778 m)   Wt 111.1 kg   SpO2 95%   BMI 35.15 kg/m   Physical Exam Vitals and nursing note reviewed.  Constitutional:      General: He is not in acute distress.    Appearance: Normal appearance. He is not ill-appearing, toxic-appearing or diaphoretic.     Comments: Well-developed adult male.  Lying in the  semi-Fowlers position.  Appears to be in pain.  HENT:     Head: Normocephalic and atraumatic.     Right Ear: External ear normal.     Left Ear: External ear normal.     Nose: Nose normal.     Mouth/Throat:     Mouth: Mucous membranes are moist.     Pharynx: Oropharynx is clear. No oropharyngeal exudate or posterior oropharyngeal erythema.  Eyes:     Extraocular Movements: Extraocular movements intact.  Cardiovascular:     Rate and Rhythm: Normal rate and regular rhythm.     Pulses: Normal pulses.     Heart sounds: Normal heart sounds. No murmur heard. No friction rub. No gallop.   Pulmonary:     Effort: Pulmonary effort is normal. No respiratory distress.     Breath sounds: Normal breath sounds. No stridor. No wheezing, rhonchi or rales.  Abdominal:     General: Abdomen is flat.     Palpations: Abdomen is soft.     Tenderness: There is abdominal tenderness. There is no right CVA tenderness or left CVA tenderness.     Comments: Moderate abdominal tenderness noted along the right lateral abdomen as well as right lower quadrant.  No flank pain.  No midline spine pain.  Musculoskeletal:        General: Normal range of motion.     Cervical back: Normal range of motion and neck supple. No tenderness.  Skin:    General: Skin is warm and dry.  Neurological:     General: No focal deficit present.     Mental Status: He is alert and oriented to person, place, and time.  Psychiatric:        Mood and Affect: Mood normal.        Behavior: Behavior normal.    ED Results / Procedures / Treatments   Labs (all labs ordered are listed, but only abnormal results are displayed) Labs Reviewed  COMPREHENSIVE METABOLIC PANEL - Abnormal; Notable for the following components:      Result Value   Glucose, Bld 123 (*)    Creatinine, Ser 1.28 (*)    All other components within normal limits  URINALYSIS, ROUTINE W REFLEX MICROSCOPIC - Abnormal; Notable for the following components:   Hgb urine  dipstick LARGE (*)    Ketones, ur 5 (*)    Protein, ur 30 (*)    RBC / HPF >50 (*)    Bacteria, UA RARE (*)    All other components within normal limits  RESP PANEL BY RT-PCR (FLU A&B, COVID) [id number]  URINE CULTURE  CBC WITH DIFFERENTIAL/PLATELET    EKG None  Radiology CT Renal Stone Study  Result Date: 05/09/2020 CLINICAL DATA:  Right flank pain EXAM:  CT ABDOMEN AND PELVIS WITHOUT CONTRAST TECHNIQUE: Multidetector CT imaging of the abdomen and pelvis was performed following the standard protocol without IV contrast. COMPARISON:  December 07, 2012 FINDINGS: Lower chest: Lung bases are clear. Hepatobiliary: There is hepatic steatosis. No focal liver lesions are appreciable on this noncontrast enhanced study. Gallbladder wall is not appreciably thickened. There is no biliary duct dilatation. Pancreas: There is no pancreatic mass or inflammatory focus. Spleen: No splenic lesions are evident. A small splenule is noted anterior to the spleen. Adrenals/Urinary Tract: Adrenals bilaterally appear normal. There is no appreciable renal mass on either side. There is moderate hydronephrosis on the right. There is no hydronephrosis on the left. There is a 1 mm calculus in the lower pole of the left kidney. There is a calculus at the right ureteropelvic junction measuring 1.6 x 1.1 cm. No other ureteral calculi are evident. Urinary bladder is midline with wall thickness within normal limits. Stomach/Bowel: There is no appreciable bowel wall or mesenteric thickening. Terminal ileum appears normal. There is no evident bowel obstruction. There is no free air or portal venous air. Vascular/Lymphatic: There is no abdominal aortic aneurysm. No vascular lesions are evident on this noncontrast enhanced study. There is no evident adenopathy in the abdomen or pelvis. Reproductive: Prostate and seminal vesicles are normal in size and configuration. Other: Appendix absent. There is no periappendiceal region inflammatory  change. No evident abscess or ascites in the abdomen or pelvis. There is mild fat in the umbilicus. Musculoskeletal: There are no blastic or lytic bone lesions. There is no intramuscular lesion. IMPRESSION: 1. There is a calculus at the right ureteropelvic junction measuring 1.6 x 1.1 cm causing moderate hydronephrosis on the right. 2.  1 mm nonobstructing calculus lower pole left kidney. 3. No bowel wall thickening or bowel obstruction. No abscess in the abdomen or pelvis. Appendix absent. 4.  Hepatic steatosis. Electronically Signed   By: Lowella Grip III M.D.   On: 05/09/2020 08:01    Procedures Procedures   Medications Ordered in ED Medications  ceFAZolin (ANCEF) IVPB 2g/100 mL premix (has no administration in time range)  HYDROmorphone (DILAUDID) injection 1 mg (1 mg Intravenous Given 05/09/20 0726)  ketorolac (TORADOL) injection 60 mg (60 mg Intramuscular Given 05/09/20 0818)   ED Course  I have reviewed the triage vital signs and the nursing notes.  Pertinent labs & imaging results that were available during my care of the patient were reviewed by me and considered in my medical decision making (see chart for details).  Clinical Course as of 05/09/20 1140  Thu May 09, 2020  3295 Patient discussed with Dr. Milford Cage with urology.  Feel that he will likely need a stent today.  Requested that I order a COVID-19 test.  Keep patient n.p.o.  Patient informed and he is amenable with the plan.  States his pain is controlled at this time. [LJ]  1019 SARS Coronavirus 2 by RT PCR: NEGATIVE [LJ]  1884 Patient reassessed.  Sleeping comfortably in bed.  States his pain is still well controlled. [LJ]    Clinical Course User Index [LJ] Rayna Sexton, PA-C   MDM Rules/Calculators/A&P                          Patient is a 30 year old male who presents the emergency department with right flank pain and right lower abdominal pain.  CT renal stone study shows a calculus at the right  ureteropelvic junction measuring 1.6 x  1.1 cm causing moderate hydronephrosis on the right.  Pain well controlled with Dilaudid as well as IM Toradol.  Patient reassessed on multiple occasions.  Patient discussed with Dr. Milford Cage with urology.  Recommends patient be kept n.p.o. and request that we order a respiratory panel.  This was ordered and is negative.  Dr. Milford Cage will perform urgent cystoscopy and insertion of a right JJ stent.  This was discussed with the patient and he is amenable.  Final Clinical Impression(s) / ED Diagnoses Final diagnoses:  Kidney stone   Rx / DC Orders ED Discharge Orders    None       Rayna Sexton, PA-C 05/09/20 1141    Tegeler, Gwenyth Allegra, MD 05/09/20 1535

## 2020-05-09 NOTE — Interval H&P Note (Signed)
History and Physical Interval Note:  05/09/2020 2:10 PM  Jordan Barnett  has presented today for surgery, with the diagnosis of Right Ureteral Calculi.  The various methods of treatment have been discussed with the patient and family. After consideration of risks, benefits and other options for treatment, the patient has consented to  Procedure(s): CYSTOSCOPY WITH RETROGRADE PYELOGRAM/URETERAL STENT PLACEMENT (Right) as a surgical intervention.  The patient's history has been reviewed, patient examined, no change in status, stable for surgery.  I have reviewed the patient's chart and labs.  Questions were answered to the patient's satisfaction.     [name]

## 2020-05-09 NOTE — Discharge Instructions (Signed)

## 2020-05-09 NOTE — Anesthesia Preprocedure Evaluation (Addendum)
Anesthesia Evaluation  Patient identified by MRN, date of birth, ID band Patient awake    Reviewed: Allergy & Precautions, H&P , NPO status , Patient's Chart, lab work & pertinent test results  Airway Mallampati: II  TM Distance: >3 FB Neck ROM: Full    Dental no notable dental hx. (+) Teeth Intact, Dental Advisory Given   Pulmonary neg pulmonary ROS, Patient abstained from smoking., former smoker,    Pulmonary exam normal breath sounds clear to auscultation       Cardiovascular negative cardio ROS Normal cardiovascular exam Rhythm:Regular Rate:Normal     Neuro/Psych negative neurological ROS  negative psych ROS   GI/Hepatic negative GI ROS, Neg liver ROS,   Endo/Other  diabetes, Type 2Hypothyroidism   Renal/GU negative Renal ROS  negative genitourinary   Musculoskeletal negative musculoskeletal ROS (+)   Abdominal   Peds negative pediatric ROS (+)  Hematology negative hematology ROS (+)   Anesthesia Other Findings   Reproductive/Obstetrics negative OB ROS                            Anesthesia Physical Anesthesia Plan  ASA: II and emergent  Anesthesia Plan: General   Post-op Pain Management:    Induction: Intravenous  PONV Risk Score and Plan: 2 and Ondansetron, Dexamethasone and Treatment may vary due to age or medical condition  Airway Management Planned: LMA  Additional Equipment:   Intra-op Plan:   Post-operative Plan: Extubation in OR  Informed Consent: I have reviewed the patients History and Physical, chart, labs and discussed the procedure including the risks, benefits and alternatives for the proposed anesthesia with the patient or authorized representative who has indicated his/her understanding and acceptance.     Dental advisory given  Plan Discussed with: CRNA and Surgeon  Anesthesia Plan Comments:        Anesthesia Quick Evaluation

## 2020-05-10 ENCOUNTER — Encounter (HOSPITAL_COMMUNITY): Payer: Self-pay | Admitting: Urology

## 2020-05-10 LAB — URINE CULTURE: Culture: NO GROWTH

## 2020-05-11 LAB — URINE CULTURE: Culture: NO GROWTH

## 2020-05-15 ENCOUNTER — Other Ambulatory Visit: Payer: Self-pay | Admitting: Family Medicine

## 2020-05-15 DIAGNOSIS — E039 Hypothyroidism, unspecified: Secondary | ICD-10-CM

## 2020-05-24 ENCOUNTER — Other Ambulatory Visit: Payer: Self-pay | Admitting: Urology

## 2020-05-27 ENCOUNTER — Other Ambulatory Visit (HOSPITAL_COMMUNITY): Payer: Self-pay | Admitting: Urology

## 2020-05-27 ENCOUNTER — Ambulatory Visit: Payer: 59 | Admitting: Family

## 2020-05-27 DIAGNOSIS — N200 Calculus of kidney: Secondary | ICD-10-CM

## 2020-05-28 ENCOUNTER — Encounter: Payer: Self-pay | Admitting: Family

## 2020-06-03 ENCOUNTER — Ambulatory Visit: Payer: 59 | Admitting: Family Medicine

## 2020-06-03 ENCOUNTER — Encounter: Payer: Self-pay | Admitting: Family Medicine

## 2020-06-03 DIAGNOSIS — R059 Cough, unspecified: Secondary | ICD-10-CM | POA: Diagnosis not present

## 2020-06-03 DIAGNOSIS — R0981 Nasal congestion: Secondary | ICD-10-CM

## 2020-06-03 NOTE — Progress Notes (Signed)
   Virtual Visit via telephone Note Due to COVID-19 pandemic this visit was conducted virtually. This visit type was conducted due to national recommendations for restrictions regarding the COVID-19 Pandemic (e.g. social distancing, sheltering in place) in an effort to limit this patient's exposure and mitigate transmission in our community. All issues noted in this document were discussed and addressed.  A physical exam was not performed with this format.  I connected with Thomes Lolling on 06/03/20 at by telephone and verified that I am speaking with the correct person using two identifiers. Kohner Orlick is currently located at home and no one is currently with him during the visit. The provider, Gabriel Earing, FNP is located in their office at time of visit.  I discussed the limitations, risks, security and privacy concerns of performing an evaluation and management service by telephone and the availability of in person appointments. I also discussed with the patient that there may be a patient responsible charge related to this service. The patient expressed understanding and agreed to proceed.  CC: cough  History and Present Illness:  HPI  Alyjah reports a dry cough, congestion, and hoarseness x1 day. He denies fever, body aches, chills, sore throat, headache, nausea, vomiting, or abdominal pain. Denies shortness of breath. He has taken Dayquil and zyrtec for his symptoms without much improvement.    ROS As per HPI.   Observations/Objective: Alert and oriented x 3. Able to speak in full sentences without difficulty.   Assessment and Plan: Dajour was seen today for cough.  Diagnoses and all orders for this visit:  Cough Covid test pending, quarantine until results. Continue Robitussin for cough.  -     Novel Coronavirus, NAA ([facility]); Future  Head congestion Covid test pending, quarantine until results. Mucinex for congestion, continue zyrtec. Stay well hydrated.  -      Novel Coronavirus, NAA ([facility]); Future    Follow Up Instructions: Return to office for new or worsening symptoms, or if symptoms persist.     I discussed the assessment and treatment plan with the patient. The patient was provided an opportunity to ask questions and all were answered. The patient agreed with the plan and demonstrated an understanding of the instructions.   The patient was advised to call back or seek an in-person evaluation if the symptoms worsen or if the condition fails to improve as anticipated.  The above assessment and management plan was discussed with the patient. The patient verbalized understanding of and has agreed to the management plan. Patient is aware to call the clinic if symptoms persist or worsen. Patient is aware when to return to the clinic for a follow-up visit. Patient educated on when it is appropriate to go to the emergency department.   Time call ended:  0939  I provided 9 minutes of phone time during this encounter.    Gabriel Earing, FNP

## 2020-06-03 NOTE — Addendum Note (Signed)
Addended by: Margorie John on: 06/03/2020 04:04 PM   Modules accepted: Orders

## 2020-06-04 LAB — NOVEL CORONAVIRUS, NAA: SARS-CoV-2, NAA: NOT DETECTED

## 2020-06-04 LAB — SARS-COV-2, NAA 2 DAY TAT

## 2020-06-13 ENCOUNTER — Other Ambulatory Visit: Payer: Self-pay | Admitting: Family

## 2020-06-13 ENCOUNTER — Ambulatory Visit: Payer: 59 | Admitting: Family

## 2020-06-13 ENCOUNTER — Encounter: Payer: Self-pay | Admitting: Family

## 2020-06-13 ENCOUNTER — Other Ambulatory Visit: Payer: Self-pay

## 2020-06-13 VITALS — BP 149/92 | HR 79 | Temp 97.1°F | Ht 70.0 in | Wt 259.2 lb

## 2020-06-13 DIAGNOSIS — I152 Hypertension secondary to endocrine disorders: Secondary | ICD-10-CM

## 2020-06-13 DIAGNOSIS — E785 Hyperlipidemia, unspecified: Secondary | ICD-10-CM | POA: Diagnosis not present

## 2020-06-13 DIAGNOSIS — E1159 Type 2 diabetes mellitus with other circulatory complications: Secondary | ICD-10-CM

## 2020-06-13 DIAGNOSIS — E1169 Type 2 diabetes mellitus with other specified complication: Secondary | ICD-10-CM

## 2020-06-13 MED ORDER — ROSUVASTATIN CALCIUM 5 MG PO TABS: 5.0000 mg | ORAL_TABLET | Freq: Every day | ORAL | 1 refills | Status: DC

## 2020-06-13 MED ORDER — LISINOPRIL 10 MG PO TABS: 10.0000 mg | ORAL_TABLET | Freq: Every day | ORAL | 3 refills | Status: DC

## 2020-06-13 MED ORDER — ONDANSETRON HCL 4 MG PO TABS: 4.0000 mg | ORAL_TABLET | Freq: Three times a day (TID) | ORAL | 0 refills | Status: DC | PRN

## 2020-06-13 MED ORDER — BLOOD GLUCOSE METER KIT: PACK | 0 refills | Status: AC

## 2020-06-13 NOTE — Progress Notes (Signed)
Subjective:    Patient ID: Jordan Barnett, male    DOB: 1990-09-02, 30 y.o.   MRN: [id number]  Chief Complaint  Patient presents with  . Diabetes  . Sore Throat   Pt presents to the office today to discuss a new diabetic. His last A1C was 04/12/20 was 7.2. We started him on metformin 500 mg.   He had a stent placed for a kidney stone on 05/09/20 and is scheduled for removal on 07/02/20. He is followed by Urologists.  Diabetes He presents for his initial diabetic visit. He has type 2 diabetes mellitus. There are no hypoglycemic associated symptoms. Pertinent negatives for diabetes include no blurred vision and no foot paresthesias. Symptoms are stable. Pertinent negatives for diabetic complications include no CVA or heart disease. Risk factors for coronary artery disease include diabetes mellitus and male sex. He is following a generally unhealthy diet. (Does not check at home) Eye exam is current.  Sore Throat  This is a new problem. The current episode started today. The problem has been waxing and waning. The pain is worse on the left side. There has been no fever. The pain is mild. Pertinent negatives include no coughing, hoarse voice, shortness of breath or trouble swallowing. He has tried acetaminophen for the symptoms. The treatment provided mild relief.  Hypertension This is a new problem. The current episode started more than 1 month ago. The problem has been waxing and waning since onset. The problem is uncontrolled. Pertinent negatives include no blurred vision, malaise/fatigue, peripheral edema or shortness of breath. The current treatment provides moderate improvement. There is no history of CVA.      Review of Systems  Constitutional: Negative for malaise/fatigue.  HENT: Negative for hoarse voice and trouble swallowing.   Eyes: Negative for blurred vision.  Respiratory: Negative for cough and shortness of breath.   All other systems reviewed and are negative.       Objective:   Physical Exam Vitals reviewed.  Constitutional:      General: He is not in acute distress.    Appearance: He is well-developed. He is obese.  HENT:     Head: Normocephalic.     Right Ear: External ear normal.     Left Ear: External ear normal.  Eyes:     General:        Right eye: No discharge.        Left eye: No discharge.     Pupils: Pupils are equal, round, and reactive to light.  Neck:     Thyroid: No thyromegaly.  Cardiovascular:     Rate and Rhythm: Normal rate and regular rhythm.     Heart sounds: Normal heart sounds. No murmur heard.   Pulmonary:     Effort: Pulmonary effort is normal. No respiratory distress.     Breath sounds: Normal breath sounds. No wheezing.  Abdominal:     General: Bowel sounds are normal. There is no distension.     Palpations: Abdomen is soft.     Tenderness: There is no abdominal tenderness.  Musculoskeletal:        General: No tenderness. Normal range of motion.     Cervical back: Normal range of motion and neck supple.  Skin:    General: Skin is warm and dry.     Findings: No erythema or rash.  Neurological:     Mental Status: He is alert and oriented to person, place, and time.     Cranial Nerves: No cranial  nerve deficit.     Deep Tendon Reflexes: Reflexes are normal and symmetric.  Psychiatric:        Behavior: Behavior normal.        Thought Content: Thought content normal.        Judgment: Judgment normal.      BP (!) 149/92   Pulse 79   Temp (!) 97.1 F (36.2 C) (Temporal)   Ht '5\' 10"'  (1.778 m)   Wt 259 lb 3.2 oz (117.6 kg)   BMI 37.19 kg/m       Assessment & Plan:  [name] comes in today with chief complaint of Diabetes and Sore Throat   Diagnosis and orders addressed:  1. Type 2 diabetes mellitus with other specified complication, without long-term current use of insulin (HCC) Continue medications  - blood glucose meter kit and supplies; Dispense based on patient and insurance  preference. Use up to four times daily as directed. (FOR ICD-10 E10.9, E11.9).  Dispense: 1 each; Refill: 0 - lisinopril (ZESTRIL) 10 MG tablet; Take 1 tablet (10 mg total) by mouth daily.  Dispense: 90 tablet; Refill: 3 - BMP8+EGFR - Microalbumin / creatinine urine ratio  2. Hyperlipidemia, unspecified hyperlipidemia type - rosuvastatin (CRESTOR) 5 MG tablet; Take 1 tablet (5 mg total) by mouth daily.  Dispense: 90 tablet; Refill: 1 - BMP8+EGFR  3. Hypertension associated with diabetes ([place]) Start Lisinopril 10 mg today -Daily blood pressure log given with instructions on how to fill out and told to bring to next visit -Dash diet information given -Exercise encouraged - Stress Management  -Continue current meds   Health Maintenance reviewed Diet and exercise encouraged  Follow up plan: 1 month and needs appt to see clinical pharm   Evelina Dun, FNP

## 2020-06-13 NOTE — Patient Instructions (Signed)
Diabetes Mellitus Basics  Diabetes mellitus, or diabetes, is a long-term (chronic) disease. It occurs when the body does not properly use sugar (glucose) that is released from food after you eat. Diabetes mellitus may be caused by one or both of these problems:  Your pancreas does not make enough of a hormone called insulin.  Your body does not react in a normal way to the insulin that it makes. Insulin lets glucose enter cells in your body. This gives you energy. If you have diabetes, glucose cannot get into cells. This causes high blood glucose (hyperglycemia). How to treat and manage diabetes You may need to take insulin or other diabetes medicines daily to keep your glucose in balance. If you are prescribed insulin, you will learn how to give yourself insulin by injection. You may need to adjust the amount of insulin you take based on the foods that you eat. You will need to check your blood glucose levels using a glucose monitor as told by your health care provider. The readings can help determine if you have low or high blood glucose. Generally, you should have these blood glucose levels:  Before meals (preprandial): 80-130 mg/dL (4.4-7.2 mmol/L).  After meals (postprandial): below 180 mg/dL (10 mmol/L).  Hemoglobin A1c (HbA1c) level: less than 7%. Your health care provider will set treatment goals for you. Keep all follow-up visits. This is important. Follow these instructions at home: Diabetes medicines Take your diabetes medicines every day as told by your health care provider. List your diabetes medicines here:  Name of medicine: ______________________________ ? Amount (dose): _______________ Time (a.m./p.m.): _______________ Notes: ___________________________________  Name of medicine: ______________________________ ? Amount (dose): _______________ Time (a.m./p.m.): _______________ Notes: ___________________________________  Name of medicine:  ______________________________ ? Amount (dose): _______________ Time (a.m./p.m.): _______________ Notes: ___________________________________ Insulin If you use insulin, list the types of insulin you use here:  Insulin type: ______________________________ ? Amount (dose): _______________ Time (a.m./p.m.): _______________Notes: ___________________________________  Insulin type: ______________________________ ? Amount (dose): _______________ Time (a.m./p.m.): _______________ Notes: ___________________________________  Insulin type: ______________________________ ? Amount (dose): _______________ Time (a.m./p.m.): _______________ Notes: ___________________________________  Insulin type: ______________________________ ? Amount (dose): _______________ Time (a.m./p.m.): _______________ Notes: ___________________________________  Insulin type: ______________________________ ? Amount (dose): _______________ Time (a.m./p.m.): _______________ Notes: ___________________________________ Managing blood glucose Check your blood glucose levels using a glucose monitor as told by your health care provider. Write down the times that you check your glucose levels here:  Time: _______________ Notes: ___________________________________  Time: _______________ Notes: ___________________________________  Time: _______________ Notes: ___________________________________  Time: _______________ Notes: ___________________________________  Time: _______________ Notes: ___________________________________  Time: _______________ Notes: ___________________________________   Low blood glucose Low blood glucose (hypoglycemia) is when glucose is at or below 70 mg/dL (3.9 mmol/L). Symptoms may include:  Feeling: ? Hungry. ? Sweaty and clammy. ? Irritable or easily upset. ? Dizzy. ? Sleepy.  Having: ? A fast heartbeat. ? A headache. ? A change in your vision. ? Numbness around the mouth, lips, or  tongue.  Having trouble with: ? Moving (coordination). ? Sleeping. Treating low blood glucose To treat low blood glucose, eat or drink something containing sugar right away. If you can think clearly and swallow safely, follow the 15:15 rule:  Take 15 grams of a fast-acting carb (carbohydrate), as told by your health care provider.  Some fast-acting carbs are: ? Glucose tablets: take 3-4 tablets. ? Hard candy: eat 3-5 pieces. ? Fruit juice: drink 4 oz (120 mL). ? Regular (not diet) soda: drink 4-6 oz (120-180 mL). ? Honey or sugar:   eat 1 Tbsp (15 mL).  Check your blood glucose levels 15 minutes after you take the carb.  If your glucose is still at or below 70 mg/dL (3.9 mmol/L), take 15 grams of a carb again.  If your glucose does not go above 70 mg/dL (3.9 mmol/L) after 3 tries, get help right away.  After your glucose goes back to normal, eat a meal or a snack within 1 hour. Treating very low blood glucose If your glucose is at or below 54 mg/dL (3 mmol/L), you have very low blood glucose (severe hypoglycemia). This is an emergency. Do not wait to see if the symptoms will go away. Get medical help right away. Call your local emergency services (911 in the [place] Do not drive yourself to the hospital. Questions to ask your health care provider  Should I talk with a diabetes educator?  What equipment will I need to care for myself at home?  What diabetes medicines do I need? When should I take them?  How often do I need to check my blood glucose levels?  What number can I call if I have questions?  When is my follow-up visit?  Where can I find a support group for people with diabetes? Where to find more information  American Diabetes Association: www.diabetes.org  Association of Diabetes Care and Education Specialists: www.diabeteseducator.org Contact a health care provider if:  Your blood glucose is at or above 240 mg/dL (13.3 mmol/L) for 2 days in a row.  You have  been sick or have had a fever for 2 days or more, and you are not getting better.  You have any of these problems for more than 6 hours: ? You cannot eat or drink. ? You feel nauseous. ? You vomit. ? You have diarrhea. Get help right away if:  Your blood glucose is lower than 54 mg/dL (3 mmol/L).  You get confused.  You have trouble thinking clearly.  You have trouble breathing. These symptoms may represent a serious problem that is an emergency. Do not wait to see if the symptoms will go away. Get medical help right away. Call your local emergency services (911 in the [place] Do not drive yourself to the hospital. Summary  Diabetes mellitus is a chronic disease that occurs when the body does not properly use sugar (glucose) that is released from food after you eat.  Take insulin and diabetes medicines as told.  Check your blood glucose every day, as often as told.  Keep all follow-up visits. This is important. This information is not intended to replace advice given to you by your health care provider. Make sure you discuss any questions you have with your health care provider. Document Revised: 07/04/2019 Document Reviewed: 07/04/2019 Elsevier Patient Education  2021 Elsevier Inc.  Diabetes Mellitus and Nutrition, Adult When you have diabetes, or diabetes mellitus, it is very important to have healthy eating habits because your blood sugar (glucose) levels are greatly affected by what you eat and drink. Eating healthy foods in the right amounts, at about the same times every day, can help you:  Control your blood glucose.  Lower your risk of heart disease.  Improve your blood pressure.  Reach or maintain a healthy weight. What can affect my meal plan? Every person with diabetes is different, and each person has different needs for a meal plan. Your health care provider may recommend that you work with a dietitian to make a meal plan that is best for you. Your   meal plan may  vary depending on factors such as:  The calories you need.  The medicines you take.  Your weight.  Your blood glucose, blood pressure, and cholesterol levels.  Your activity level.  Other health conditions you have, such as heart or kidney disease. How do carbohydrates affect me? Carbohydrates, also called carbs, affect your blood glucose level more than any other type of food. Eating carbs naturally raises the amount of glucose in your blood. Carb counting is a method for keeping track of how many carbs you eat. Counting carbs is important to keep your blood glucose at a healthy level, especially if you use insulin or take certain oral diabetes medicines. It is important to know how many carbs you can safely have in each meal. This is different for every person. Your dietitian can help you calculate how many carbs you should have at each meal and for each snack. How does alcohol affect me? Alcohol can cause a sudden decrease in blood glucose (hypoglycemia), especially if you use insulin or take certain oral diabetes medicines. Hypoglycemia can be a life-threatening condition. Symptoms of hypoglycemia, such as sleepiness, dizziness, and confusion, are similar to symptoms of having too much alcohol.  Do not drink alcohol if: ? Your health care provider tells you not to drink. ? You are pregnant, may be pregnant, or are planning to become pregnant.  If you drink alcohol: ? Do not drink on an empty stomach. ? Limit how much you use to:  0-1 drink a day for women.  0-2 drinks a day for men. ? Be aware of how much alcohol is in your drink. In the [place] one drink equals one 12 oz bottle of beer (355 mL), one 5 oz glass of wine (148 mL), or one 1 oz glass of hard liquor (44 mL). ? Keep yourself hydrated with water, diet soda, or unsweetened iced tea.  Keep in mind that regular soda, juice, and other mixers may contain a lot of sugar and must be counted as carbs. What are tips for  following this plan? Reading food labels  Start by checking the serving size on the "Nutrition Facts" label of packaged foods and drinks. The amount of calories, carbs, fats, and other nutrients listed on the label is based on one serving of the item. Many items contain more than one serving per package.  Check the total grams (g) of carbs in one serving. You can calculate the number of servings of carbs in one serving by dividing the total carbs by 15. For example, if a food has 30 g of total carbs per serving, it would be equal to 2 servings of carbs.  Check the number of grams (g) of saturated fats and trans fats in one serving. Choose foods that have a low amount or none of these fats.  Check the number of milligrams (mg) of salt (sodium) in one serving. Most people should limit total sodium intake to less than 2,300 mg per day.  Always check the nutrition information of foods labeled as "low-fat" or "nonfat." These foods may be higher in added sugar or refined carbs and should be avoided.  Talk to your dietitian to identify your daily goals for nutrients listed on the label. Shopping  Avoid buying canned, pre-made, or processed foods. These foods tend to be high in fat, sodium, and added sugar.  Shop around the outside edge of the grocery store. This is where you will most often find fresh fruits and   vegetables, bulk grains, fresh meats, and fresh dairy. Cooking  Use low-heat cooking methods, such as baking, instead of high-heat cooking methods like deep frying.  Cook using healthy oils, such as olive, canola, or sunflower oil.  Avoid cooking with butter, cream, or high-fat meats. Meal planning  Eat meals and snacks regularly, preferably at the same times every day. Avoid going long periods of time without eating.  Eat foods that are high in fiber, such as fresh fruits, vegetables, beans, and whole grains. Talk with your dietitian about how many servings of carbs you can eat at  each meal.  Eat 4-6 oz (112-168 g) of lean protein each day, such as lean meat, chicken, fish, eggs, or tofu. One ounce (oz) of lean protein is equal to: ? 1 oz (28 g) of meat, chicken, or fish. ? 1 egg. ?  cup (62 g) of tofu.  Eat some foods each day that contain healthy fats, such as avocado, nuts, seeds, and fish.   What foods should I eat? Fruits Berries. Apples. Oranges. Peaches. Apricots. Plums. Grapes. Mango. Papaya. Pomegranate. Kiwi. Cherries. Vegetables Lettuce. Spinach. Leafy greens, including kale, chard, collard greens, and mustard greens. Beets. Cauliflower. Cabbage. Broccoli. Carrots. Green beans. Tomatoes. Peppers. Onions. Cucumbers. Brussels sprouts. Grains Whole grains, such as whole-wheat or whole-grain bread, crackers, tortillas, cereal, and pasta. Unsweetened oatmeal. Quinoa. Brown or wild rice. Meats and other proteins Seafood. Poultry without skin. Lean cuts of poultry and beef. Tofu. Nuts. Seeds. Dairy Low-fat or fat-free dairy products such as milk, yogurt, and cheese. The items listed above may not be a complete list of foods and beverages you can eat. Contact a dietitian for more information. What foods should I avoid? Fruits Fruits canned with syrup. Vegetables Canned vegetables. Frozen vegetables with butter or cream sauce. Grains Refined white flour and flour products such as bread, pasta, snack foods, and cereals. Avoid all processed foods. Meats and other proteins Fatty cuts of meat. Poultry with skin. Breaded or fried meats. Processed meat. Avoid saturated fats. Dairy Full-fat yogurt, cheese, or milk. Beverages Sweetened drinks, such as soda or iced tea. The items listed above may not be a complete list of foods and beverages you should avoid. Contact a dietitian for more information. Questions to ask a health care provider  Do I need to meet with a diabetes educator?  Do I need to meet with a dietitian?  What number can I call if I have  questions?  When are the best times to check my blood glucose? Where to find more information:  American Diabetes Association: diabetes.org  Academy of Nutrition and Dietetics: www.eatright.org  National Institute of Diabetes and Digestive and Kidney Diseases: www.niddk.nih.gov  Association of Diabetes Care and Education Specialists: www.diabeteseducator.org Summary  It is important to have healthy eating habits because your blood sugar (glucose) levels are greatly affected by what you eat and drink.  A healthy meal plan will help you control your blood glucose and maintain a healthy lifestyle.  Your health care provider may recommend that you work with a dietitian to make a meal plan that is best for you.  Keep in mind that carbohydrates (carbs) and alcohol have immediate effects on your blood glucose levels. It is important to count carbs and to use alcohol carefully. This information is not intended to replace advice given to you by your health care provider. Make sure you discuss any questions you have with your health care provider. Document Revised: 02/07/2019 Document Reviewed: 02/07/2019 Elsevier Patient   Education  2021 Elsevier Inc.  

## 2020-06-20 NOTE — Patient Instructions (Addendum)
DUE TO COVID-19 ONLY ONE VISITOR IS ALLOWED TO COME WITH YOU AND STAY IN THE WAITING ROOM ONLY DURING PRE OP AND PROCEDURE DAY OF SURGERY. THE 1 VISITOR  MAY VISIT WITH YOU AFTER SURGERY IN YOUR PRIVATE ROOM DURING VISITING HOURS ONLY!  YOU NEED TO HAVE A COVID 19 TEST ON__4/15_____ @_2 :35______, THIS TEST MUST BE DONE BEFORE SURGERY,  COVID TESTING SITE [address] [place] , IT IS ON THE RIGHT GOING OUT [address] APPROXIMATELY  2 MINUTES PAST [employer] ON THE RIGHT. ONCE YOUR COVID TEST IS COMPLETED,  PLEASE BEGIN THE QUARANTINE INSTRUCTIONS AS OUTLINED IN YOUR HANDOUT.                Jordan Barnett   Your procedure is scheduled on:  07/02/20   Report to [facility] Main  Entrance   Report to admitting at  8:00 AM     Call this number if you have problems the morning of surgery [phone]    Remember: Do not eat food or drink liquids :After Midnight  . BRUSH YOUR TEETH MORNING OF SURGERY AND RINSE YOUR MOUTH OUT, NO CHEWING GUM CANDY OR MINTS.     Take these medicines the morning of surgery with A SIP OF WATER: Lexapro, Levothyroxine  How to Manage Your Diabetes Before and After Surgery  Why is it important to control my blood sugar before and after surgery? . Improving blood sugar levels before and after surgery helps healing and can limit problems. . A way of improving blood sugar control is eating a healthy diet by: o  Eating less sugar and carbohydrates o  Increasing activity/exercise o  Talking with your doctor about reaching your blood sugar goals . High blood sugars (greater than 180 mg/dL) can raise your risk of infections and slow your recovery, so you will need to focus on controlling your diabetes during the weeks before surgery. . Make sure that the doctor who takes care of your diabetes knows about your planned surgery including the date and location.  How do I manage my blood sugar before surgery? . Check your blood  sugar at least 4 times a day, starting 2 days before surgery, to make sure that the level is not too high or low. o Check your blood sugar the morning of your surgery when you wake up and every 2 hours until you get to the Short Stay unit. . If your blood sugar is less than 70 mg/dL, you will need to treat for low blood sugar: o Do not take insulin. o Treat a low blood sugar (less than 70 mg/dL) with  cup of clear juice (cranberry or apple), 4 glucose tablets, OR glucose gel. o Recheck blood sugar in 15 minutes after treatment (to make sure it is greater than 70 mg/dL). If your blood sugar is not greater than 70 mg/dL on recheck, call [phone] for further instructions. . Report your blood sugar to the short stay nurse when you get to Short Stay.  . If you are admitted to the hospital after surgery: o Your blood sugar will be checked by the staff and you will probably be given insulin after surgery (instead of oral diabetes medicines) to make sure you have good blood sugar levels. o The goal for blood sugar control after surgery is 80-180 mg/dL.   WHAT DO I DO ABOUT MY DIABETES MEDICATION?  [phone] Do not take oral diabetes medicines (pills) the morning of surgery.  Do not bring valuables to the hospital.  IS NOT             RESPONSIBLE   FOR VALUABLES.  Contacts, dentures or bridgework may not be worn into surgery.       Patients discharged the day of surgery will not be allowed to drive home  . IF YOU ARE HAVING SURGERY AND GOING HOME THE SAME DAY, YOU MUST HAVE AN ADULT TO DRIVE YOU HOME AND BE WITH YOU FOR 24 HOURS. YOU MAY GO HOME BY TAXI OR UBER OR ORTHERWISE, BUT AN ADULT MUST ACCOMPANY YOU HOME AND STAY WITH YOU FOR 24 HOURS.  Name and phone number of your driver:  Special Instructions: N/A              Please read over the following fact sheets you were given: _____________________________________________________________________             [facility] -  Preparing for Surgery Before surgery, you can play an important role.  Because skin is not sterile, your skin needs to be as free of germs as possible.  You can reduce the number of germs on your skin by washing with CHG (chlorahexidine gluconate) soap before surgery.  CHG is an antiseptic cleaner which kills germs and bonds with the skin to continue killing germs even after washing. Please DO NOT use if you have an allergy to CHG or antibacterial soaps.  If your skin becomes reddened/irritated stop using the CHG and inform your nurse when you arrive at Short Stay.   You may shave your face/neck.  Please follow these instructions carefully:  1.  Shower with CHG Soap the night before surgery and the  morning of Surgery.  2.  If you choose to wash your hair, wash your hair first as usual with your  normal  shampoo.  3.  After you shampoo, rinse your hair and body thoroughly to remove the  shampoo.                                        4.  Use CHG as you would any other liquid soap.  You can apply chg directly  to the skin and wash                       Gently with a scrungie or clean washcloth.  5.  Apply the CHG Soap to your body ONLY FROM THE NECK DOWN.   Do not use on face/ open                           Wound or open sores. Avoid contact with eyes, ears mouth and genitals (private parts).                       Wash face,  Genitals (private parts) with your normal soap.             6.  Wash thoroughly, paying special attention to the area where your surgery  will be performed.  7.  Thoroughly rinse your body with warm water from the neck down.  8.  DO NOT shower/wash with your normal soap after using and rinsing off  the CHG Soap.             9.  Pat yourself dry  with a clean towel.            10.  Wear clean pajamas.            11.  Place clean sheets on your bed the night of your first shower and do not  sleep with pets. Day of Surgery : Do not apply any lotions/deodorants the morning of  surgery.  Please wear clean clothes to the hospital/surgery center.  FAILURE TO FOLLOW THESE INSTRUCTIONS MAY RESULT IN THE CANCELLATION OF YOUR SURGERY PATIENT SIGNATURE_________________________________  NURSE SIGNATURE__________________________________  ________________________________________________________________________

## 2020-06-21 ENCOUNTER — Other Ambulatory Visit: Payer: Self-pay

## 2020-06-21 ENCOUNTER — Encounter (HOSPITAL_COMMUNITY)
Admission: RE | Admit: 2020-06-21 | Discharge: 2020-06-21 | Disposition: A | Discharge status: Home / Self Care | Payer: 59 | Source: Ambulatory Visit | Attending: Urology | Admitting: Urology

## 2020-06-21 ENCOUNTER — Encounter (HOSPITAL_COMMUNITY): Payer: Self-pay

## 2020-06-21 DIAGNOSIS — Z01818 Encounter for other preprocedural examination: Secondary | ICD-10-CM | POA: Diagnosis present

## 2020-06-21 HISTORY — DX: Personal history of urinary calculi: Z87.442

## 2020-06-21 HISTORY — DX: Essential (primary) hypertension: I10

## 2020-06-21 HISTORY — DX: Sleep apnea, unspecified: G47.30

## 2020-06-21 HISTORY — DX: Hypothyroidism, unspecified: E03.9

## 2020-06-21 LAB — CBC
HCT: 43.2 % (ref 39.0–52.0)
Hemoglobin: 14.2 g/dL (ref 13.0–17.0)
MCH: 29.9 pg (ref 26.0–34.0)
MCHC: 32.9 g/dL (ref 30.0–36.0)
MCV: 90.9 fL (ref 80.0–100.0)
Platelets: 351 10*3/uL (ref 150–400)
RBC: 4.75 MIL/uL (ref 4.22–5.81)
RDW: 12.8 % (ref 11.5–15.5)
WBC: 7.8 10*3/uL (ref 4.0–10.5)
nRBC: 0 % (ref 0.0–0.2)

## 2020-06-21 LAB — BASIC METABOLIC PANEL
Anion gap: 8 (ref 5–15)
BUN: 17 mg/dL (ref 6–20)
CO2: 25 mmol/L (ref 22–32)
Calcium: 8.9 mg/dL (ref 8.9–10.3)
Chloride: 105 mmol/L (ref 98–111)
Creatinine, Ser: 0.89 mg/dL (ref 0.61–1.24)
GFR, Estimated: >60 mL/min (ref >60)
Glucose, Bld: 160 mg/dL — ABNORMAL HIGH (ref 70–99)
Potassium: 4.1 mmol/L (ref 3.5–5.1)
Sodium: 138 mmol/L (ref 135–145)

## 2020-06-21 LAB — HEMOGLOBIN A1C
Hgb A1c MFr Bld: 6.5 % — ABNORMAL HIGH (ref 4.8–5.6)
Mean Plasma Glucose: 139.85 mg/dL

## 2020-06-21 LAB — GLUCOSE, CAPILLARY: Glucose-Capillary: 173 mg/dL — ABNORMAL HIGH (ref 70–99)

## 2020-06-21 NOTE — Progress Notes (Signed)
COVID Vaccine Completed:Yes Date COVID Vaccine completed:7/21 COVID vaccine manufacturer: Pfizer    PCP - Jannifer Rodney FNP Cardiologist - none  Chest x-ray - no EKG - 06/21/20-epic Stress Test - 06/27/19-epic ECHO - 07/05/19-epic Cardiac Cath - no Pacemaker/ICD device last checked:NA  Sleep Study - yes-mild CPAP - no  Fasting Blood Sugar - 115-164 Checks Blood Sugar _____ times a day. Once /week  Blood Thinner Instructions:NA Aspirin Instructions: Last Dose:  Anesthesia review:   Patient denies shortness of breath, fever, cough and chest pain at PAT appointment yes  Patient verbalized understanding of instructions that were given to them at the PAT appointment. Patient was also instructed that they will need to review over the PAT instructions again at home before surgery.Yes Pt reports no SOB with any activities.

## 2020-06-28 ENCOUNTER — Other Ambulatory Visit (HOSPITAL_COMMUNITY)
Admission: RE | Admit: 2020-06-28 | Discharge: 2020-06-28 | Disposition: A | Discharge status: Home / Self Care | Payer: 59 | Source: Ambulatory Visit | Attending: Urology | Admitting: Urology

## 2020-06-28 DIAGNOSIS — Z20822 Contact with and (suspected) exposure to covid-19: Secondary | ICD-10-CM | POA: Insufficient documentation

## 2020-06-28 DIAGNOSIS — Z01812 Encounter for preprocedural laboratory examination: Secondary | ICD-10-CM | POA: Diagnosis not present

## 2020-06-28 LAB — SARS CORONAVIRUS 2 (TAT 6-24 HRS): SARS Coronavirus 2: NEGATIVE

## 2020-07-01 ENCOUNTER — Other Ambulatory Visit: Payer: Self-pay | Admitting: Physician Assistant

## 2020-07-01 MED ORDER — DEXTROSE 5 % IV SOLN: 2.0000 g | Freq: Once | INTRAVENOUS | Status: DC

## 2020-07-01 MED ORDER — GENTAMICIN SULFATE 40 MG/ML IJ SOLN
5.0000 mg/kg | Freq: Once | INTRAVENOUS | Status: AC
  Administered 2020-07-02: 450 mg via INTRAVENOUS
  Filled 2020-07-01: qty 11.25

## 2020-07-01 NOTE — Anesthesia Preprocedure Evaluation (Addendum)
Anesthesia Evaluation  Patient identified by MRN, date of birth, ID band Patient awake    Reviewed: Allergy & Precautions, H&P , NPO status , Patient's Chart, lab work & pertinent test results  Airway Mallampati: III  TM Distance: >3 FB Neck ROM: Full    Dental no notable dental hx. (+) Teeth Intact, Dental Advisory Given   Pulmonary sleep apnea , Patient abstained from smoking., former smoker,    Pulmonary exam normal breath sounds clear to auscultation       Cardiovascular hypertension, Pt. on medications Normal cardiovascular exam Rhythm:Regular Rate:Normal     Neuro/Psych PSYCHIATRIC DISORDERS Anxiety Depression    GI/Hepatic   Endo/Other  diabetes, Well Controlled, Type 2Hypothyroidism   Renal/GU      Musculoskeletal   Abdominal   Peds  Hematology   Anesthesia Other Findings   Reproductive/Obstetrics                            Anesthesia Physical  Anesthesia Plan  ASA: III  Anesthesia Plan: General   Post-op Pain Management:    Induction: Intravenous  PONV Risk Score and Plan: 2 and Ondansetron, Dexamethasone and Treatment may vary due to age or medical condition  Airway Management Planned: Oral ETT and LMA  Additional Equipment: None  Intra-op Plan:   Post-operative Plan: Extubation in OR  Informed Consent: I have reviewed the patients History and Physical, chart, labs and discussed the procedure including the risks, benefits and alternatives for the proposed anesthesia with the patient or authorized representative who has indicated his/her understanding and acceptance.     Dental advisory given  Plan Discussed with: CRNA, Surgeon and Anesthesiologist  Anesthesia Plan Comments:        Anesthesia Quick Evaluation

## 2020-07-02 ENCOUNTER — Ambulatory Visit (HOSPITAL_COMMUNITY): Payer: 59

## 2020-07-02 ENCOUNTER — Inpatient Hospital Stay (HOSPITAL_COMMUNITY)
Admission: AD | Admit: 2020-07-02 | Discharge: 2020-07-05 | DRG: 661 | Disposition: A | Discharge status: Home / Self Care | Payer: 59 | Source: Ambulatory Visit | Attending: Urology | Admitting: Urology

## 2020-07-02 ENCOUNTER — Encounter (HOSPITAL_COMMUNITY): Payer: Self-pay

## 2020-07-02 ENCOUNTER — Ambulatory Visit (HOSPITAL_COMMUNITY): Payer: 59 | Admitting: Certified Registered Nurse Anesthetist

## 2020-07-02 ENCOUNTER — Other Ambulatory Visit: Payer: Self-pay

## 2020-07-02 ENCOUNTER — Encounter (HOSPITAL_COMMUNITY): Payer: Self-pay | Admitting: Urology

## 2020-07-02 ENCOUNTER — Ambulatory Visit (HOSPITAL_COMMUNITY)
Admission: RE | Admit: 2020-07-02 | Discharge: 2020-07-02 | Disposition: A | Discharge status: Home / Self Care | Payer: 59 | Source: Ambulatory Visit | Attending: Urology | Admitting: Urology

## 2020-07-02 ENCOUNTER — Encounter (HOSPITAL_COMMUNITY)
Admission: AD | Disposition: A | Discharge status: Home / Self Care | Payer: Self-pay | Source: Ambulatory Visit | Attending: Urology

## 2020-07-02 ENCOUNTER — Ambulatory Visit (HOSPITAL_COMMUNITY): Payer: 59 | Admitting: Physician Assistant

## 2020-07-02 DIAGNOSIS — Z79890 Hormone replacement therapy (postmenopausal): Secondary | ICD-10-CM

## 2020-07-02 DIAGNOSIS — E785 Hyperlipidemia, unspecified: Secondary | ICD-10-CM | POA: Diagnosis present

## 2020-07-02 DIAGNOSIS — E7800 Pure hypercholesterolemia, unspecified: Secondary | ICD-10-CM | POA: Diagnosis present

## 2020-07-02 DIAGNOSIS — Z79899 Other long term (current) drug therapy: Secondary | ICD-10-CM

## 2020-07-02 DIAGNOSIS — Z20822 Contact with and (suspected) exposure to covid-19: Secondary | ICD-10-CM | POA: Diagnosis present

## 2020-07-02 DIAGNOSIS — Z833 Family history of diabetes mellitus: Secondary | ICD-10-CM | POA: Diagnosis not present

## 2020-07-02 DIAGNOSIS — I10 Essential (primary) hypertension: Secondary | ICD-10-CM | POA: Diagnosis present

## 2020-07-02 DIAGNOSIS — F419 Anxiety disorder, unspecified: Secondary | ICD-10-CM | POA: Diagnosis present

## 2020-07-02 DIAGNOSIS — E119 Type 2 diabetes mellitus without complications: Secondary | ICD-10-CM | POA: Diagnosis present

## 2020-07-02 DIAGNOSIS — N132 Hydronephrosis with renal and ureteral calculous obstruction: Secondary | ICD-10-CM | POA: Diagnosis present

## 2020-07-02 DIAGNOSIS — N1330 Unspecified hydronephrosis: Secondary | ICD-10-CM

## 2020-07-02 DIAGNOSIS — E039 Hypothyroidism, unspecified: Secondary | ICD-10-CM | POA: Diagnosis present

## 2020-07-02 DIAGNOSIS — Z87891 Personal history of nicotine dependence: Secondary | ICD-10-CM

## 2020-07-02 DIAGNOSIS — F32A Depression, unspecified: Secondary | ICD-10-CM | POA: Diagnosis present

## 2020-07-02 DIAGNOSIS — Z7984 Long term (current) use of oral hypoglycemic drugs: Secondary | ICD-10-CM | POA: Diagnosis not present

## 2020-07-02 DIAGNOSIS — R110 Nausea: Secondary | ICD-10-CM | POA: Diagnosis not present

## 2020-07-02 DIAGNOSIS — N200 Calculus of kidney: Secondary | ICD-10-CM

## 2020-07-02 DIAGNOSIS — J939 Pneumothorax, unspecified: Secondary | ICD-10-CM

## 2020-07-02 HISTORY — PX: NEPHROLITHOTOMY: SHX5134

## 2020-07-02 HISTORY — PX: IR URETERAL STENT RIGHT NEW ACCESS W/O SEP NEPHROSTOMY CATH: IMG6076

## 2020-07-02 LAB — BASIC METABOLIC PANEL
Anion gap: 10 (ref 5–15)
BUN: 17 mg/dL (ref 6–20)
CO2: 23 mmol/L (ref 22–32)
Calcium: 8.8 mg/dL — ABNORMAL LOW (ref 8.9–10.3)
Chloride: 106 mmol/L (ref 98–111)
Creatinine, Ser: 1.3 mg/dL — ABNORMAL HIGH (ref 0.61–1.24)
GFR, Estimated: >60 mL/min (ref >60)
Glucose, Bld: 183 mg/dL — ABNORMAL HIGH (ref 70–99)
Potassium: 4.7 mmol/L (ref 3.5–5.1)
Sodium: 139 mmol/L (ref 135–145)

## 2020-07-02 LAB — CBC
HCT: 42.3 % (ref 39.0–52.0)
Hemoglobin: 13.9 g/dL (ref 13.0–17.0)
MCH: 30.2 pg (ref 26.0–34.0)
MCHC: 32.9 g/dL (ref 30.0–36.0)
MCV: 92 fL (ref 80.0–100.0)
Platelets: 347 10*3/uL (ref 150–400)
RBC: 4.6 MIL/uL (ref 4.22–5.81)
RDW: 12.9 % (ref 11.5–15.5)
WBC: 14.7 10*3/uL — ABNORMAL HIGH (ref 4.0–10.5)
nRBC: 0 % (ref 0.0–0.2)

## 2020-07-02 LAB — GLUCOSE, CAPILLARY
Glucose-Capillary: 119 mg/dL — ABNORMAL HIGH (ref 70–99)
Glucose-Capillary: 184 mg/dL — ABNORMAL HIGH (ref 70–99)
Glucose-Capillary: 179 mg/dL — ABNORMAL HIGH (ref 70–99)
Glucose-Capillary: 164 mg/dL — ABNORMAL HIGH (ref 70–99)

## 2020-07-02 LAB — PROTIME-INR
INR: 0.9 (ref 0.8–1.2)
Prothrombin Time: 11.9 seconds (ref 11.4–15.2)

## 2020-07-02 SURGERY — NEPHROLITHOTOMY PERCUTANEOUS
Anesthesia: General | Laterality: Right

## 2020-07-02 MED ORDER — LACTATED RINGERS IV SOLN: INTRAVENOUS | Status: DC

## 2020-07-02 MED ORDER — DM-GUAIFENESIN ER 30-600 MG PO TB12: 1.0000 | ORAL_TABLET | Freq: Two times a day (BID) | ORAL | Status: DC | PRN

## 2020-07-02 MED ORDER — PROPOFOL 10 MG/ML IV BOLUS
INTRAVENOUS | Status: DC | PRN
  Administered 2020-07-02: 200 mg via INTRAVENOUS

## 2020-07-02 MED ORDER — ROSUVASTATIN CALCIUM 5 MG PO TABS
5.0000 mg | ORAL_TABLET | Freq: Every day | ORAL | Status: DC
  Administered 2020-07-03 – 2020-07-05 (×3): 5 mg via ORAL
  Filled 2020-07-02 (×3): qty 1

## 2020-07-02 MED ORDER — MIDAZOLAM HCL 2 MG/2ML IJ SOLN
INTRAMUSCULAR | Status: AC
  Filled 2020-07-02: qty 4

## 2020-07-02 MED ORDER — LEVOTHYROXINE SODIUM 100 MCG PO TABS
100.0000 ug | ORAL_TABLET | Freq: Every day | ORAL | Status: DC
  Administered 2020-07-03 – 2020-07-04 (×2): 100 ug via ORAL
  Filled 2020-07-02 (×2): qty 1

## 2020-07-02 MED ORDER — ONDANSETRON HCL 4 MG PO TABS: 4.0000 mg | ORAL_TABLET | Freq: Three times a day (TID) | ORAL | Status: DC | PRN

## 2020-07-02 MED ORDER — SODIUM CHLORIDE 0.9 % IV SOLN: 250.0000 mL | INTRAVENOUS | Status: DC | PRN

## 2020-07-02 MED ORDER — SODIUM CHLORIDE 0.9 % IV SOLN
INTRAVENOUS | Status: DC | PRN
  Administered 2020-07-02: 10 mL/h via INTRAVENOUS

## 2020-07-02 MED ORDER — OXYCODONE-ACETAMINOPHEN 5-325 MG PO TABS
1.0000 | ORAL_TABLET | ORAL | Status: DC | PRN
  Administered 2020-07-03 (×2): 1 via ORAL
  Filled 2020-07-02 (×3): qty 1

## 2020-07-02 MED ORDER — FENTANYL CITRATE (PF) 100 MCG/2ML IJ SOLN
INTRAMUSCULAR | Status: DC | PRN
  Administered 2020-07-02 (×2): 25 ug via INTRAVENOUS
  Administered 2020-07-02: 50 ug via INTRAVENOUS

## 2020-07-02 MED ORDER — OXYCODONE-ACETAMINOPHEN 5-325 MG PO TABS: 1.0000 | ORAL_TABLET | ORAL | 0 refills | Status: DC | PRN

## 2020-07-02 MED ORDER — FENTANYL CITRATE (PF) 100 MCG/2ML IJ SOLN
INTRAMUSCULAR | Status: AC
  Filled 2020-07-02: qty 2

## 2020-07-02 MED ORDER — ONDANSETRON HCL 4 MG/2ML IJ SOLN: 4.0000 mg | Freq: Once | INTRAMUSCULAR | Status: DC | PRN

## 2020-07-02 MED ORDER — INSULIN ASPART 100 UNIT/ML ~~LOC~~ SOLN
0.0000 [IU] | SUBCUTANEOUS | Status: DC
  Administered 2020-07-02 – 2020-07-03 (×3): 3 [IU] via SUBCUTANEOUS
  Administered 2020-07-03: 2 [IU] via SUBCUTANEOUS
  Administered 2020-07-03: 3 [IU] via SUBCUTANEOUS
  Administered 2020-07-04 (×2): 2 [IU] via SUBCUTANEOUS

## 2020-07-02 MED ORDER — LIDOCAINE-EPINEPHRINE 1 %-1:100000 IJ SOLN
INTRAMUSCULAR | Status: AC
  Filled 2020-07-02: qty 1

## 2020-07-02 MED ORDER — MIDAZOLAM HCL 2 MG/2ML IJ SOLN
INTRAMUSCULAR | Status: DC | PRN
  Administered 2020-07-02: 0.5 mg via INTRAVENOUS
  Administered 2020-07-02 (×2): 1 mg via INTRAVENOUS
  Administered 2020-07-02: 0.5 mg via INTRAVENOUS

## 2020-07-02 MED ORDER — ESCITALOPRAM OXALATE 10 MG PO TABS
10.0000 mg | ORAL_TABLET | Freq: Every day | ORAL | Status: DC
  Administered 2020-07-03 – 2020-07-05 (×3): 10 mg via ORAL
  Filled 2020-07-02 (×3): qty 1

## 2020-07-02 MED ORDER — SODIUM CHLORIDE 0.9 % IV SOLN
2.0000 g | Freq: Once | INTRAVENOUS | Status: AC
  Administered 2020-07-02: 2 g via INTRAVENOUS
  Filled 2020-07-02: qty 2

## 2020-07-02 MED ORDER — ONDANSETRON HCL 4 MG/2ML IJ SOLN
4.0000 mg | INTRAMUSCULAR | Status: DC | PRN
  Administered 2020-07-02 – 2020-07-04 (×3): 4 mg via INTRAVENOUS
  Filled 2020-07-02 (×3): qty 2

## 2020-07-02 MED ORDER — PHENYLEPHRINE 40 MCG/ML (10ML) SYRINGE FOR IV PUSH (FOR BLOOD PRESSURE SUPPORT)
PREFILLED_SYRINGE | INTRAVENOUS | Status: AC
  Filled 2020-07-02: qty 20

## 2020-07-02 MED ORDER — HYDROMORPHONE HCL 1 MG/ML IJ SOLN
0.5000 mg | INTRAMUSCULAR | Status: DC | PRN
  Administered 2020-07-02 – 2020-07-03 (×4): 0.5 mg via INTRAVENOUS
  Administered 2020-07-04: 1 mg via INTRAVENOUS
  Administered 2020-07-04: 0.5 mg via INTRAVENOUS
  Filled 2020-07-02 (×6): qty 1

## 2020-07-02 MED ORDER — LISINOPRIL 10 MG PO TABS
10.0000 mg | ORAL_TABLET | Freq: Every day | ORAL | Status: DC
  Administered 2020-07-03 – 2020-07-05 (×3): 10 mg via ORAL
  Filled 2020-07-02 (×3): qty 1

## 2020-07-02 MED ORDER — FENTANYL CITRATE (PF) 100 MCG/2ML IJ SOLN: 25.0000 ug | INTRAMUSCULAR | Status: DC | PRN

## 2020-07-02 MED ORDER — IOHEXOL 300 MG/ML  SOLN
50.0000 mL | Freq: Once | INTRAMUSCULAR | Status: AC | PRN
  Administered 2020-07-02: 20 mL

## 2020-07-02 MED ORDER — ONDANSETRON HCL 4 MG/2ML IJ SOLN
INTRAMUSCULAR | Status: DC | PRN
  Administered 2020-07-02: 4 mg via INTRAVENOUS

## 2020-07-02 MED ORDER — SODIUM CHLORIDE 0.9 % IR SOLN
Status: DC | PRN
  Administered 2020-07-02: 12000 mL

## 2020-07-02 MED ORDER — SUGAMMADEX SODIUM 500 MG/5ML IV SOLN
INTRAVENOUS | Status: DC | PRN
  Administered 2020-07-02: 234 mg via INTRAVENOUS

## 2020-07-02 MED ORDER — BRIMONIDINE TARTRATE 0.025 % OP SOLN: 1.0000 [drp] | Freq: Every day | OPHTHALMIC | Status: DC | PRN

## 2020-07-02 MED ORDER — ACETAMINOPHEN 325 MG PO TABS: 325.0000 mg | ORAL_TABLET | ORAL | Status: DC | PRN

## 2020-07-02 MED ORDER — ORAL CARE MOUTH RINSE: 15.0000 mL | Freq: Once | OROMUCOSAL | Status: AC

## 2020-07-02 MED ORDER — MIDAZOLAM HCL 5 MG/5ML IJ SOLN
INTRAMUSCULAR | Status: DC | PRN
  Administered 2020-07-02: 2 mg via INTRAVENOUS

## 2020-07-02 MED ORDER — MEPERIDINE HCL 50 MG/ML IJ SOLN: 6.2500 mg | INTRAMUSCULAR | Status: DC | PRN

## 2020-07-02 MED ORDER — SODIUM CHLORIDE 0.9 % IV SOLN
INTRAVENOUS | Status: DC | PRN
  Administered 2020-07-02: 38 mL

## 2020-07-02 MED ORDER — DOCUSATE SODIUM 100 MG PO CAPS: 100.0000 mg | ORAL_CAPSULE | Freq: Every day | ORAL | 0 refills | Status: DC | PRN

## 2020-07-02 MED ORDER — FENTANYL CITRATE (PF) 250 MCG/5ML IJ SOLN
INTRAMUSCULAR | Status: AC
  Filled 2020-07-02: qty 5

## 2020-07-02 MED ORDER — OXYCODONE HCL 5 MG/5ML PO SOLN: 5.0000 mg | Freq: Once | ORAL | Status: DC | PRN

## 2020-07-02 MED ORDER — LIDOCAINE 2% (20 MG/ML) 5 ML SYRINGE
INTRAMUSCULAR | Status: DC | PRN
  Administered 2020-07-02: 100 mg via INTRAVENOUS

## 2020-07-02 MED ORDER — PHENYLEPHRINE 40 MCG/ML (10ML) SYRINGE FOR IV PUSH (FOR BLOOD PRESSURE SUPPORT)
PREFILLED_SYRINGE | INTRAVENOUS | Status: DC | PRN
  Administered 2020-07-02: 80 ug via INTRAVENOUS
  Administered 2020-07-02 (×2): 120 ug via INTRAVENOUS

## 2020-07-02 MED ORDER — DIPHENHYDRAMINE HCL 50 MG/ML IJ SOLN: 12.5000 mg | Freq: Four times a day (QID) | INTRAMUSCULAR | Status: DC | PRN

## 2020-07-02 MED ORDER — ACETAMINOPHEN 160 MG/5ML PO SOLN: 325.0000 mg | ORAL | Status: DC | PRN

## 2020-07-02 MED ORDER — SODIUM CHLORIDE 0.9 % IV SOLN: INTRAVENOUS | Status: DC

## 2020-07-02 MED ORDER — DOCUSATE SODIUM 100 MG PO CAPS
100.0000 mg | ORAL_CAPSULE | Freq: Two times a day (BID) | ORAL | Status: DC
  Administered 2020-07-02 – 2020-07-03 (×3): 100 mg via ORAL
  Filled 2020-07-02 (×5): qty 1

## 2020-07-02 MED ORDER — MIDAZOLAM HCL 2 MG/2ML IJ SOLN
INTRAMUSCULAR | Status: AC
  Filled 2020-07-02: qty 2

## 2020-07-02 MED ORDER — LIDOCAINE 2% (20 MG/ML) 5 ML SYRINGE
INTRAMUSCULAR | Status: AC
  Filled 2020-07-02: qty 5

## 2020-07-02 MED ORDER — SODIUM CHLORIDE 0.9% FLUSH: 3.0000 mL | INTRAVENOUS | Status: DC | PRN

## 2020-07-02 MED ORDER — ROCURONIUM BROMIDE 10 MG/ML (PF) SYRINGE
PREFILLED_SYRINGE | INTRAVENOUS | Status: AC
  Filled 2020-07-02: qty 10

## 2020-07-02 MED ORDER — DIPHENHYDRAMINE HCL 12.5 MG/5ML PO ELIX
12.5000 mg | ORAL_SOLUTION | Freq: Four times a day (QID) | ORAL | Status: DC | PRN
Start: 2020-07-02 — End: 2020-07-05

## 2020-07-02 MED ORDER — BACITRACIN-NEOMYCIN-POLYMYXIN 400-5-5000 EX OINT: 1.0000 "application " | TOPICAL_OINTMENT | Freq: Three times a day (TID) | CUTANEOUS | Status: DC | PRN

## 2020-07-02 MED ORDER — DEXAMETHASONE SODIUM PHOSPHATE 10 MG/ML IJ SOLN
INTRAMUSCULAR | Status: DC | PRN
  Administered 2020-07-02: 4 mg via INTRAVENOUS

## 2020-07-02 MED ORDER — PROPOFOL 10 MG/ML IV BOLUS
INTRAVENOUS | Status: AC
  Filled 2020-07-02: qty 40

## 2020-07-02 MED ORDER — ROCURONIUM BROMIDE 10 MG/ML (PF) SYRINGE
PREFILLED_SYRINGE | INTRAVENOUS | Status: DC | PRN
  Administered 2020-07-02: 60 mg via INTRAVENOUS

## 2020-07-02 MED ORDER — FENTANYL CITRATE (PF) 100 MCG/2ML IJ SOLN
INTRAMUSCULAR | Status: DC | PRN
  Administered 2020-07-02 (×2): 50 ug via INTRAVENOUS
  Administered 2020-07-02: 100 ug via INTRAVENOUS
  Administered 2020-07-02 (×2): 50 ug via INTRAVENOUS

## 2020-07-02 MED ORDER — ACETAMINOPHEN 325 MG PO TABS: 650.0000 mg | ORAL_TABLET | ORAL | Status: DC | PRN

## 2020-07-02 MED ORDER — PHENYLEPHRINE HCL-NACL 10-0.9 MG/250ML-% IV SOLN
INTRAVENOUS | Status: DC | PRN
  Administered 2020-07-02: 80 ug/min via INTRAVENOUS

## 2020-07-02 MED ORDER — SUGAMMADEX SODIUM 500 MG/5ML IV SOLN
INTRAVENOUS | Status: AC
  Filled 2020-07-02: qty 5

## 2020-07-02 MED ORDER — OXYCODONE HCL 5 MG PO TABS: 5.0000 mg | ORAL_TABLET | Freq: Once | ORAL | Status: DC | PRN

## 2020-07-02 MED ORDER — CHLORHEXIDINE GLUCONATE 0.12 % MT SOLN
15.0000 mL | Freq: Once | OROMUCOSAL | Status: AC
  Administered 2020-07-02: 15 mL via OROMUCOSAL

## 2020-07-02 MED ORDER — SODIUM CHLORIDE 0.9 % IV SOLN
INTRAVENOUS | Status: AC
  Administered 2020-07-02: 2 g
  Filled 2020-07-02: qty 20

## 2020-07-02 MED ORDER — SODIUM CHLORIDE 0.9% FLUSH
3.0000 mL | Freq: Two times a day (BID) | INTRAVENOUS | Status: DC
  Administered 2020-07-02 – 2020-07-04 (×4): 3 mL via INTRAVENOUS

## 2020-07-02 MED ORDER — ZOLPIDEM TARTRATE 5 MG PO TABS: 5.0000 mg | ORAL_TABLET | Freq: Every evening | ORAL | Status: DC | PRN

## 2020-07-02 MED ORDER — CEPHALEXIN 500 MG PO CAPS: 500.0000 mg | ORAL_CAPSULE | Freq: Two times a day (BID) | ORAL | 0 refills | Status: AC

## 2020-07-02 SURGICAL SUPPLY — 69 items
BAG URINE DRAIN 2000ML AR STRL (UROLOGICAL SUPPLIES) IMPLANT
BAG URO CATCHER STRL LF (MISCELLANEOUS) IMPLANT
BASKET LASER NITINOL 1.9FR (BASKET) IMPLANT
BASKET ZERO TIP NITINOL 2.4FR (BASKET) ×2 IMPLANT
BENZOIN TINCTURE PRP APPL 2/3 (GAUZE/BANDAGES/DRESSINGS) ×2 IMPLANT
BLADE SURG 15 STRL LF DISP TIS (BLADE) ×1 IMPLANT
BLADE SURG 15 STRL SS (BLADE) ×1
CATH FOLEY 2W COUNCIL 20FR 5CC (CATHETERS) ×2 IMPLANT
CATH FOLEY 2WAY SLVR  5CC 16FR (CATHETERS) ×1
CATH FOLEY 2WAY SLVR 5CC 16FR (CATHETERS) ×1 IMPLANT
CATH INTERMIT  6FR 70CM (CATHETERS) IMPLANT
CATH MULTI PURPOSE 16FR DRAIN (CATHETERS) IMPLANT
CATH ROBINSON RED A/P 20FR (CATHETERS) IMPLANT
CATH ULTRATHANE 14FR (CATHETERS) IMPLANT
CATH URET 5FR 28IN OPEN ENDED (CATHETERS) ×2 IMPLANT
CATH URET DUAL LUMEN 6-10FR 50 (CATHETERS) ×2 IMPLANT
CATH X-FORCE N30 NEPHROSTOMY (TUBING) IMPLANT
CHLORAPREP W/TINT 26 (MISCELLANEOUS) ×4 IMPLANT
COVER WAND RF STERILE (DRAPES) IMPLANT
DRAPE C-ARM 42X120 X-RAY (DRAPES) ×2 IMPLANT
DRAPE LINGEMAN PERC (DRAPES) ×2 IMPLANT
DRAPE SHEET LG 3/4 BI-LAMINATE (DRAPES) ×2 IMPLANT
DRAPE SURG IRRIG POUCH 19X23 (DRAPES) ×2 IMPLANT
DRSG PAD ABDOMINAL 8X10 ST (GAUZE/BANDAGES/DRESSINGS) ×4 IMPLANT
DRSG TEGADERM 4X4.75 (GAUZE/BANDAGES/DRESSINGS) IMPLANT
DRSG TEGADERM 8X12 (GAUZE/BANDAGES/DRESSINGS) ×4 IMPLANT
EXTRACTOR STONE PERC NCIRCLE (MISCELLANEOUS) ×2 IMPLANT
GAUZE SPONGE 4X4 12PLY STRL (GAUZE/BANDAGES/DRESSINGS) ×2 IMPLANT
GLOVE SURG ENC TEXT LTX SZ7.5 (GLOVE) ×2 IMPLANT
GOWN STRL REUS W/TWL LRG LVL3 (GOWN DISPOSABLE) ×2 IMPLANT
GUIDEWIRE AMPLAZ .035X145 (WIRE) ×2 IMPLANT
GUIDEWIRE ANG ZIPWIRE 035X150 (WIRE) ×2 IMPLANT
GUIDEWIRE STR DUAL SENSOR (WIRE) ×6 IMPLANT
GUIDEWIRE SUPER STIFF (WIRE) ×2 IMPLANT
GUIDEWIRE ZIPWRE .038 STRAIGHT (WIRE) ×2 IMPLANT
IV SET EXTENSION CATH 6 NF (IV SETS) IMPLANT
KIT BASIN OR (CUSTOM PROCEDURE TRAY) ×2 IMPLANT
KIT PROBE 340X3.4XDISP GRN (MISCELLANEOUS) IMPLANT
KIT PROBE TRILOGY 3.4X340 (MISCELLANEOUS)
KIT PROBE TRILOGY 3.9X350 (MISCELLANEOUS) ×2 IMPLANT
KIT TURNOVER KIT A (KITS) ×2 IMPLANT
LASER FIB FLEXIVA PULSE ID 365 (Laser) IMPLANT
MANIFOLD NEPTUNE II (INSTRUMENTS) ×2 IMPLANT
NEEDLE TROCAR 18X15 ECHO (NEEDLE) IMPLANT
NEEDLE TROCAR 18X20 (NEEDLE) IMPLANT
NS IRRIG 1000ML POUR BTL (IV SOLUTION) ×2 IMPLANT
PACK CYSTO (CUSTOM PROCEDURE TRAY) ×2 IMPLANT
SHEATH PEELAWAY SET 9 (SHEATH) IMPLANT
SPONGE LAP 4X18 RFD (DISPOSABLE) ×2 IMPLANT
STENT URET 6FRX26 CONTOUR (STENTS) ×2 IMPLANT
SURGIFLO W/THROMBIN 8M KIT (HEMOSTASIS) IMPLANT
SUT SILK 0 FSL (SUTURE) IMPLANT
SUT SILK 2 0 SH (SUTURE) ×2 IMPLANT
SUT VIC AB 2-0 SH 27 (SUTURE) ×1
SUT VIC AB 2-0 SH 27X BRD (SUTURE) ×1 IMPLANT
SUT VIC AB 4-0 PS2 18 (SUTURE) IMPLANT
SYR 10ML LL (SYRINGE) ×2 IMPLANT
SYR 20ML LL LF (SYRINGE) ×4 IMPLANT
SYR 50ML LL SCALE MARK (SYRINGE) ×2 IMPLANT
TOWEL OR 17X26 10 PK STRL BLUE (TOWEL DISPOSABLE) ×2 IMPLANT
TRACTIP FLEXIVA PULS ID 200XHI (Laser) IMPLANT
TRACTIP FLEXIVA PULSE ID 200 (Laser)
TRAY FOLEY MTR SLVR 16FR STAT (SET/KITS/TRAYS/PACK) ×4 IMPLANT
TUBE CONNECTING VINYL 14FR 30C (TUBING) IMPLANT
TUBING CONNECTING 10 (TUBING) ×4 IMPLANT
TUBING STONE CATCHER TRILOGY (MISCELLANEOUS) ×2 IMPLANT
TUBING UROLOGY SET (TUBING) IMPLANT
WATER STERILE IRR 1000ML POUR (IV SOLUTION) ×2 IMPLANT
WATER STERILE IRR 3000ML UROMA (IV SOLUTION) IMPLANT

## 2020-07-02 NOTE — Transfer of Care (Signed)
Immediate Anesthesia Transfer of Care Note  Patient: Jordan Barnett  Procedure(s) Performed: NEPHROLITHOTOMY PERCUTANEOUS/ URETEROSCOPY/ STENT PLACMENT/ ANTEGRADE NEPHROSTOGRAM (Right )  Patient Location: PACU  Anesthesia Type:General  Level of Consciousness: drowsy and patient cooperative  Airway & Oxygen Therapy: Patient Spontanous Breathing and Patient connected to face mask oxygen  Post-op Assessment: Report given to RN and Post -op Vital signs reviewed and stable  Post vital signs: Reviewed and stable  Last Vitals:  Vitals Value Taken Time  BP 104/54 07/02/20 1415  Temp    Pulse 117 07/02/20 1416  Resp 20 07/02/20 1416  SpO2 98 % 07/02/20 1416  Vitals shown include unvalidated device data.  Last Pain: There were no vitals filed for this visit.       Complications: No complications documented.

## 2020-07-02 NOTE — H&P (Signed)
CC/HPI: Jordan LollingJustin Barnett is a 30 year old male seen in consultation for a right renal pelvis stone for consideration of right PCNL.   He presented to the ED on 05/09/2020 for right-sided flank pain and found to have a 1.6 x 1.1 cm right UPJ stone causing moderate right hydronephrosis. He underwent cystoscopy, right retrograde pyelogram, right stent placement on 05/09/2020 by Dr. Benancio DeedsNewsome.   CT A/P 05/09/20:  My read: 1.6 x 1.1 cm RIGHT renal pelvis stone; 1 mm nonobstructing LEFT mid lower pole stone   Patient states he is having some right flank pain with urination suggesting reflux around the stone. He continues on tamsulosin 0.4 mg daily.   This is his first stone episode.   He does have a history of diabetes, hypertension, hypothyroidism.     ALLERGIES: fenofibrate    MEDICATIONS: Crestor  Metformin Hcl  Percocet  Tamsulosin Hcl 0.4 mg capsule  Levofloxacin 500 mg tablet  Lexapro  Synthroid     GU PSH: Cystoscopy Insert Stent, Right - 05/09/2020     NON-GU PSH: Appendectomy (laparoscopic) - 2011 Back surgery - 2015     GU PMH: Renal calculus - 05/17/2020    NON-GU PMH: Pyuria/other UA findings - 05/17/2020 Anxiety Depression Diabetes Type 2 Hypercholesterolemia    FAMILY HISTORY: 1 Daughter - Daughter 1 son - Son Cancer - Grandmother Diabetes - Father Heart Disease - Grandfather Hypertension - Mother   SOCIAL HISTORY: Marital Status: Married Preferred Language: English; Ethnicity: Not Hispanic Or Latino; Race: White Current Smoking Status: Patient does not smoke anymore. Has not smoked since 05/14/2013. Smoked for 7 years. Smoked 1 pack per day.   Tobacco Use Assessment Completed: Used Tobacco in last 30 days? Has never drank.  Drinks 3 caffeinated drinks per day. Patient's occupation is/was [employer].    REVIEW OF SYSTEMS:    GU Review Male:   Patient denies frequent urination, hard to postpone urination, burning/ pain with urination, get up at night to urinate,  leakage of urine, stream starts and stops, trouble starting your stream, have to strain to urinate , erection problems, and penile pain.  Gastrointestinal (Upper):   Patient denies nausea, vomiting, and indigestion/ heartburn.  Gastrointestinal (Lower):   Patient denies diarrhea and constipation.  Constitutional:   Patient denies fever, night sweats, weight loss, and fatigue.  Skin:   Patient denies skin rash/ lesion and itching.  Eyes:   Patient denies blurred vision and double vision.  Ears/ Nose/ Throat:   Patient denies sore throat and sinus problems.  Hematologic/Lymphatic:   Patient denies swollen glands and easy bruising.  Cardiovascular:   Patient denies leg swelling and chest pains.  Respiratory:   Patient denies cough and shortness of breath.  Endocrine:   Patient denies excessive thirst.  Musculoskeletal:   Patient denies back pain and joint pain.  Neurological:   Patient denies headaches and dizziness.  Psychologic:   Patient denies depression and anxiety.   VITAL SIGNS:      05/22/2020 08:07 AM  Weight 245 lb / 111.13 kg  Height 69 in / 175.26 cm  BP 118/87 mmHg  Pulse 142 /min  Temperature 97.3 F / 36.2 C  BMI 36.2 kg/m   MULTI-SYSTEM PHYSICAL EXAMINATION:    Constitutional: Well-nourished. No physical deformities. Normally developed. Good grooming.  Respiratory: No labored breathing, no use of accessory muscles.   Cardiovascular: Normal temperature, normal extremity pulses, no swelling, no varicosities.  Gastrointestinal: No mass, no tenderness, no rigidity, non obese abdomen No CVA tenderness  Complexity of Data:  Source Of History:  Patient, Medical Record Summary  Records Review:   Previous Doctor Records, Previous Hospital Records  Urine Test Review:   Urinalysis  X-Ray Review: C.T. Abdomen/Pelvis: Reviewed Films. Reviewed Report. Discussed With Patient.    Notes:                     CLINICAL DATA: Right flank pain   EXAM:  CT ABDOMEN AND PELVIS WITHOUT  CONTRAST   TECHNIQUE:  Multidetector CT imaging of the abdomen and pelvis was performed  following the standard protocol without IV contrast.   COMPARISON: December 07, 2012   FINDINGS:  Lower chest: Lung bases are clear.   Hepatobiliary: There is hepatic steatosis. No focal liver lesions  are appreciable on this noncontrast enhanced study. Gallbladder wall  is not appreciably thickened. There is no biliary duct dilatation.   Pancreas: There is no pancreatic mass or inflammatory focus.   Spleen: No splenic lesions are evident. A small splenule is noted  anterior to the spleen.   Adrenals/Urinary Tract: Adrenals bilaterally appear normal. There is  no appreciable renal mass on either side. There is moderate  hydronephrosis on the right. There is no hydronephrosis on the left.  There is a 1 mm calculus in the lower pole of the left kidney. There  is a calculus at the right ureteropelvic junction measuring 1.6 x  1.1 cm. No other ureteral calculi are evident. Urinary bladder is  midline with wall thickness within normal limits.   Stomach/Bowel: There is no appreciable bowel wall or mesenteric  thickening. Terminal ileum appears normal. There is no evident bowel  obstruction. There is no free air or portal venous air.   Vascular/Lymphatic: There is no abdominal aortic aneurysm. No  vascular lesions are evident on this noncontrast enhanced study.  There is no evident adenopathy in the abdomen or pelvis.   Reproductive: Prostate and seminal vesicles are normal in size and  configuration.   Other: Appendix absent. There is no periappendiceal region  inflammatory change. No evident abscess or ascites in the abdomen or  pelvis. There is mild fat in the umbilicus.   Musculoskeletal: There are no blastic or lytic bone lesions. There  is no intramuscular lesion.   IMPRESSION:  1. There is a calculus at the right ureteropelvic junction measuring  1.6 x 1.1 cm causing moderate  hydronephrosis on the right.   2. 1 mm nonobstructing calculus lower pole left kidney.   3. No bowel wall thickening or bowel obstruction. No abscess in the  abdomen or pelvis. Appendix absent.   4. Hepatic steatosis.    Electronically Signed  By: Bretta Bang III M.D.  On: 05/09/2020 08:01   PROCEDURES:          Urinalysis w/Scope Dipstick Dipstick Cont'd Micro  Color: Red Bilirubin: Neg mg/dL WBC/hpf: 0 - 5/hpf  Appearance: Cloudy Ketones: Neg mg/dL RBC/hpf: >05/LZJ  Specific Gravity: 1.025 Blood: 3+ ery/uL Bacteria: Few (10-25/hpf)  pH: 6.0 Protein: 3+ mg/dL Cystals: NS (Not Seen)  Glucose: Neg mg/dL Urobilinogen: 0.2 mg/dL Casts: NS (Not Seen)    Nitrites: Neg Trichomonas: Not Present    Leukocyte Esterase: 2+ leu/uL Mucous: Not Present      Epithelial Cells: NS (Not Seen)      Yeast: NS (Not Seen)      Sperm: Not Present    ASSESSMENT:      ICD-10 Details  1 GU:   Renal calculus - N20.0  PLAN:           Orders Labs Urinalysis w/Scope, CULTURE, URINE          Document Letter(s):  Created for Patient: Clinical Summary         Notes:   #1. Right renal pelvis stone:  -CT A/P 05/09/2020 with 1.6 x 1.1 cm RIGHT renal pelvis stone; 1 mm nonobstructing LEFT mid lower pole stone  -Discussed options including ESWL, right URS/LL and right PCNL. I recommend right PCNL based on size. He agrees. Discussed risks and benefits. Surgery letter sent. We will plan for IR guided right renal access.  -Obtain preoperative urine culture today.   We discussed the options for management of kidney stones, including observation, ESWL, ureteroscopy with laser lithotripsy, and PCNL. The risks and benefits of each option were discussed.  For observation I described the risks which include but are not limited to silent renal damage, life-threatening infection, need for emergent surgery, failure to pass stone, and pain.   ESWL: risks and benefits of ESWL were outlined including  infection, bleeding, pain, steinstrasse, kidney injury, need for ancillary treatments, and global anesthesia risks including but not limited to CVA, MI, DVT, PE, pneumonia, and death.   Ureteroscopy: risks and benefits of ureteroscopy were outlined, including infection, bleeding, pain, temporary ureteral stent and associated stent bother, ureteral injury, ureteral stricture, need for ancillary treatments, and global anesthesia risks including but not limited to CVA, MI, DVT, PE, pneumonia, and death.   PCNL: risks and benefits of PCNL were outlined including infection, bleeding, blood transfusion, pain, pneumothorax, bowel injury, persistent urine leak, positioning injury, inability to clear stone burden, renal laceration, arterial venous fistula or malformation, need for ancillary treatments, and global anesthesia risks including but not limited to CVA, MI, DVT, PE, pneumonia, and death.   Urology Preoperative H&P   Chief Complaint: Right nephrolithiasis  History of Present Illness: Jordan Barnett is a 30 y.o. male with right renal stone here for R PCNL. Denies fevers or chills. Urine cx 05/22/2020 NG.   Past Medical History:  Diagnosis Date  . Depression   . Diabetes mellitus without complication (HCC)   . High cholesterol   . History of kidney stones   . Hypertension   . Hypothyroidism   . Sleep apnea    mild  . Thyroid disease     Past Surgical History:  Procedure Laterality Date  . APPENDECTOMY    . BACK SURGERY  2016  . CYSTOSCOPY W/ URETERAL STENT PLACEMENT Right 05/09/2020   Procedure: CYSTOSCOPY WITH RETROGRADE PYELOGRAM/URETERAL STENT PLACEMENT;  Surgeon: [name], MD;  Location: [facility];  Service: Urology;  Laterality: Right;  . LAPAROSCOPIC APPENDECTOMY N/A 12/07/2012   Procedure: APPENDECTOMY LAPAROSCOPIC;  Surgeon: Atilano Ina, MD;  Location: [facility] OR;  Service: General;  Laterality: N/A;    Allergies:  Allergies  Allergen Reactions  . Fenofibrate Micronized      Sexual dysfunc    Family History  Problem Relation Age of Onset  . Hypertension Mother   . Diabetes Father     Social History:  reports that he quit smoking about 7 years ago. He has a 10.00 pack-year smoking history. He quit smokeless tobacco use about 5 weeks ago.  His smokeless tobacco use included snuff. He reports that he does not drink alcohol and does not use drugs.  ROS: A complete review of systems was performed.  All systems are negative except for pertinent findings as noted.  Physical Exam:  Vital  signs in last 24 hours: Temp:  [98.4 F (36.9 C)] 98.4 F (36.9 C) Jul 24, 2022 0829) Pulse Rate:  [70] 70 Jul 24, 2022 0829) Resp:  [16] 16 2022-07-24 0829) BP: (141)/(97) 141/97 24-Jul-2022 0829) SpO2:  [94 %] 94 % 07/24/22 0829) Constitutional:  Alert and oriented, No acute distress Cardiovascular: Regular rate and rhythm Respiratory: Normal respiratory effort, Lungs clear bilaterally GI: Abdomen is soft, nontender, nondistended, no abdominal masses GU: No CVA tenderness Lymphatic: No lymphadenopathy Neurologic: Grossly intact, no focal deficits Psychiatric: Normal mood and affect  Laboratory Data:  No results for input(s): WBC, HGB, HCT, PLT in the last 72 hours.  No results for input(s): NA, K, CL, GLUCOSE, BUN, CALCIUM, CREATININE in the last 72 hours.  Invalid input(s): CO3   Results for orders placed or performed during the hospital encounter of Jul 23, 2020 (from the past 24 hour(s))  Protime-INR     Status: None   Collection Time: 2020/07/23  8:52 AM  Result Value Ref Range   Prothrombin Time 11.9 11.4 - 15.2 seconds   INR 0.9 0.8 - 1.2   Recent Results (from the past 240 hour(s))  SARS CORONAVIRUS 2 (TAT 6-24 HRS) Nasopharyngeal Nasopharyngeal Swab     Status: None   Collection Time: 06/28/20  3:04 PM   Specimen: Nasopharyngeal Swab  Result Value Ref Range Status   SARS Coronavirus 2 NEGATIVE NEGATIVE Final    Comment: (NOTE) SARS-CoV-2 target nucleic acids are NOT  DETECTED.  The SARS-CoV-2 RNA is generally detectable in upper and lower respiratory specimens during the acute phase of infection. Negative results do not preclude SARS-CoV-2 infection, do not rule out co-infections with other pathogens, and should not be used as the sole basis for treatment or other patient management decisions. Negative results must be combined with clinical observations, patient history, and epidemiological information. The expected result is Negative.  Fact Sheet for Patients: HairSlick.no  Fact Sheet for Healthcare Providers: quierodirigir.com  This test is not yet approved or cleared by the [place] FDA and  has been authorized for detection and/or diagnosis of SARS-CoV-2 by FDA under an Emergency Use Authorization (EUA). This EUA will remain  in effect (meaning this test can be used) for the duration of the COVID-19 declaration under Se ction 564(b)(1) of the Act, 21 U.S.C. section 360bbb-3(b)(1), unless the authorization is terminated or revoked sooner.  Performed at [facility], [address], [place], [place] [address]     Renal Function: No results for input(s): CREATININE in the last 168 hours. Estimated Creatinine Clearance: 156.9 mL/min (by C-G formula based on SCr of 0.89 mg/dL).  Radiologic Imaging: No results found.  I independently reviewed the above imaging studies.  Assessment and Plan Jordan Barnett is a 30 y.o. male with right renal stone here for right PCNL. Preop UCx negative.   PCNL: risks and benefits of PCNL were outlined including infection, bleeding, blood transfusion, pain, pneumothorax, bowel injury, persistent urine leak, positioning injury, inability to clear stone burden, renal laceration, arterial venous fistula or malformation, need for ancillary treatments, and global anesthesia risks including but not limited to CVA, MI, DVT, PE, pneumonia, and death.    Jordan R. Laramie Meissner MD 2020/07/23, 9:26 AM  [facility] Pager: [phone]): [phone]

## 2020-07-02 NOTE — Sedation Documentation (Signed)
Patient is resting comfortably. 

## 2020-07-02 NOTE — Consult Note (Signed)
Chief Complaint: Patient was seen in consultation today for right percutaneous nephrostomy/nephroureteral catheter placement  Referring Physician(s): Gay,M  Supervising Physician: Daryll Brod  Patient Status: [facility] - Out-pt  TBA  History of Present Illness: Jordan Barnett is a 30 y.o. male with past medical history of depression, diabetes, hyperlipidemia, hypertension, hypothyroidism, mild sleep apnea, thyroid disease and nephrolithiasis with recent imaging showing a calculus at the right ureteropelvic junction measuring 1.6 x 1.1 cm and causing moderate hydronephrosis. He also has a 1 mm nonobstructing calculus in the lower pole of the left kidney.  He presents today for right percutaneous nephrostomy/nephroureteral catheter placement prior to nephrolithotomy.  Past Medical History:  Diagnosis Date  . Depression   . Diabetes mellitus without complication ([facility])   . High cholesterol   . History of kidney stones   . Hypertension   . Hypothyroidism   . Sleep apnea    mild  . Thyroid disease     Past Surgical History:  Procedure Laterality Date  . APPENDECTOMY    . BACK SURGERY  2016  . CYSTOSCOPY W/ URETERAL STENT PLACEMENT Right 05/09/2020   Procedure: CYSTOSCOPY WITH RETROGRADE PYELOGRAM/URETERAL STENT PLACEMENT;  Surgeon: Remi Haggard, MD;  Location: [facility];  Service: Urology;  Laterality: Right;  . LAPAROSCOPIC APPENDECTOMY N/A 12/07/2012   Procedure: APPENDECTOMY LAPAROSCOPIC;  Surgeon: Gayland Curry, MD;  Location: [facility] OR;  Service: General;  Laterality: N/A;    Allergies: Fenofibrate micronized  Medications: Prior to Admission medications   Medication Sig Start Date End Date Taking? Authorizing Provider  Brimonidine Tartrate (LUMIFY) 0.025 % SOLN Place 1 drop into both eyes daily as needed (redness).   Yes [provider]  dextromethorphan-guaiFENesin (MUCINEX DM) 30-600 MG 12hr tablet Take 1 tablet by mouth 2 (two) times daily as needed (congestion).    Yes [provider]  escitalopram (LEXAPRO) 10 MG tablet Take 1 tablet (10 mg total) by mouth daily. 04/12/20  Yes Hawks, Christy A, FNP  levothyroxine (SYNTHROID) 100 MCG tablet TAKE ONE (1) TABLET EACH DAY Patient taking differently: Take 100 mcg by mouth daily. 05/15/20  Yes Hawks, Christy A, FNP  lisinopril (ZESTRIL) 10 MG tablet Take 1 tablet (10 mg total) by mouth daily. 06/13/20  Yes Evelina Dun A, FNP  metFORMIN (GLUCOPHAGE XR) 500 MG 24 hr tablet Take 1 tablet (500 mg total) by mouth daily with breakfast. 04/18/20 04/18/21 Yes Hawks, Christy A, FNP  ondansetron (ZOFRAN) 4 MG tablet Take 1 tablet (4 mg total) by mouth every 8 (eight) hours as needed for nausea or vomiting. 06/13/20  Yes Hawks, Christy A, FNP  rosuvastatin (CRESTOR) 5 MG tablet Take 1 tablet (5 mg total) by mouth daily. 06/13/20  Yes Hawks, Theador Hawthorne, FNP  blood glucose meter kit and supplies Dispense based on patient and insurance preference. Use up to four times daily as directed. (FOR ICD-10 E10.9, E11.9). 06/13/20   Sharion Balloon, FNP     Family History  Problem Relation Age of Onset  . Hypertension Mother   . Diabetes Father     Social History   Socioeconomic History  . Marital status: Married    Spouse name: Not on file  . Number of children: Not on file  . Years of education: Not on file  . Highest education level: Not on file  Occupational History  . Not on file  Tobacco Use  . Smoking status: Former Smoker    Packs/day: 1.00    Years: 10.00  Pack years: 10.00    Quit date: 03/16/2013    Years since quitting: 7.3  . Smokeless tobacco: Former Systems developer    Quit date: 05/27/2020  Vaping Use  . Vaping Use: Never used  Substance and Sexual Activity  . Alcohol use: No  . Drug use: No  . Sexual activity: Yes  Other Topics Concern  . Not on file  Social History Narrative  . Not on file   Social Determinants of Health   Financial Resource Strain: Not on file  Food Insecurity: Not on file   Transportation Needs: Not on file  Physical Activity: Not on file  Stress: Not on file  Social Connections: Not on file      Review of Systems currently denies fever, headache, chest pain, dyspnea, cough, abdominal pain, nausea, vomiting; does have occasional right flank pain, history of hematuria/dysuria; he is anxious  Vital Signs: Blood pressure 141/97, temp 98.4, heart rate 70, respirations 16, O2 sat 94% room air    Physical Exam awake, alert.  Chest clear to auscultation bilaterally.  Heart with regular rate and rhythm.  Abdomen soft, positive bowel sounds, nontender.  No lower extremity edema.  Imaging: No results found.  Labs:  CBC: Recent Labs    10/17/19 1028 04/12/20 1118 05/09/20 0726 06/21/20 1408  WBC 8.4 7.2 10.1 7.8  HGB 16.0 15.0 14.5 14.2  HCT 48.0 46.8 43.7 43.2  PLT 345 321 318 351    COAGS: No results for input(s): INR, APTT in the last 8760 hours.  BMP: Recent Labs    04/12/20 1118 05/09/20 0726 06/21/20 1408  NA 139 141 138  K 4.3 3.7 4.1  CL 100 105 105  CO2 '23 25 25  ' GLUCOSE 220* 123* 160*  BUN '12 20 17  ' CALCIUM 9.5 9.9 8.9  CREATININE 0.91 1.28* 0.89  GFRNONAA 113 >60 >60  GFRAA 131  --   --     LIVER FUNCTION TESTS: Recent Labs    04/12/20 1118 05/09/20 0726  BILITOT 0.2 0.6  AST 25 22  ALT 43 36  ALKPHOS 76 56  PROT 6.8 7.7  ALBUMIN 4.2 4.5    TUMOR MARKERS: No results for input(s): AFPTM, CEA, CA199, CHROMGRNA in the last 8760 hours.  Assessment and Plan: 30 y.o. male with past medical history of depression, diabetes, hyperlipidemia, hypertension, hypothyroidism, mild sleep apnea, thyroid disease and nephrolithiasis with recent imaging showing a calculus at the right ureteropelvic junction measuring 1.6 x 1.1 cm and causing moderate hydronephrosis. He also has a 1 mm nonobstructing calculus in the lower pole of the left kidney.  He presents today for right percutaneous nephrostomy/nephroureteral catheter placement  prior to nephrolithotomy.Risks and benefits of right PCN/nephroureteral catheter placement  was discussed with the patient including, but not limited to, infection, bleeding, significant bleeding causing loss or decrease in renal function or damage to adjacent structures.   All of the patient's questions were answered, patient is agreeable to proceed.  Consent signed and in chart.      Thank you for this interesting consult.  I greatly enjoyed meeting Jordan Barnett and look forward to participating in their care.  A copy of this report was sent to the requesting provider on this date.  Electronically Signed: D. Rowe Robert, PA-C 07/02/2020, 8:28 AM   I spent a total of  25 minutes   in face to face in clinical consultation, greater than 50% of which was counseling/coordinating care for right percutaneous nephrostomy/nephroureteral catheter placement

## 2020-07-02 NOTE — Anesthesia Procedure Notes (Signed)
Procedure Name: Intubation Date/Time: 07/02/2020 11:40 AM Performed by: Montel Clock, CRNA Pre-anesthesia Checklist: Patient identified, Emergency Drugs available, Suction available, Patient being monitored and Timeout performed Patient Re-evaluated:Patient Re-evaluated prior to induction Oxygen Delivery Method: Circle system utilized Preoxygenation: Pre-oxygenation with 100% oxygen Induction Type: IV induction Ventilation: Mask ventilation without difficulty and Oral airway inserted - appropriate to patient size Laryngoscope Size: Glidescope and 4 Grade View: Grade I Tube type: Oral Tube size: 7.5 mm Number of attempts: 2 Airway Equipment and Method: Video-laryngoscopy and Rigid stylet Placement Confirmation: ETT inserted through vocal cords under direct vision,  positive ETCO2 and breath sounds checked- equal and bilateral Secured at: 23 cm Tube secured with: Tape Dental Injury: Teeth and Oropharynx as per pre-operative assessment  Comments: Intubation by paramedic student. Unable to pass ETT with MAC 3. Glidescope 4, grade 1 view, balloon torn on teeth, ETT in good position positive ETT, breath sounds. Tube exchanged with bougie for 7.5. VSS.

## 2020-07-02 NOTE — Procedures (Signed)
Interventional Radiology Procedure Note  Procedure: RT PCN ACCESS FOR OR STONE CASE    Complications: None  Estimated Blood Loss:  MIN  Findings: 5FR KUMPE THRU TO THE BLADDER    Sharen Counter, MD

## 2020-07-02 NOTE — Op Note (Signed)
Operative Note  Preoperative diagnosis:  1.  Right renal stone  Postoperative diagnosis: 1.  Right renal stone  Procedure(s): 1. Right percutaneous nephrolithotomy 2. Dilation of right renal percutaneous tract under fluoroscopy 3. Right antegrade ureteroscopy with basket stone extraction 4. Right antegrade nephrostogram 5. Fluoroscopy time less than 1 hour with interpretation 6. Right ureteral stent exchange. 7. Right percutaneous nephrostomy tube placement  Surgeon: Rexene Alberts, MD  Assistants:  None  Anesthesia:  General  Complications:  None  EBL:  54m  Specimens: 1. Stones for stone analysis  Drains/Catheters: 1.   A 16-French Foley. 2.   Right 22-French Councill tip nephrostomy tube 3. Right 6Fr by 26cm ureteral stent  Intraoperative findings:   1. IR obtained access into a right lower pole calyx. Unfortunately, the access was submucosal. Initial visualization of the collecting system demonstrated clots and large 1.6cm renal stone. 2. Successful fragmentation and removal of right renal stone. 3. Successful retrograde right ureteral stent placement in the true lumen.  4. Right nephrostomy tube placement. 5. Final retrograde pyelogram demonstrated no further fragments and no extravasation of contrast.  Indication:  JConny Moeningis a 30y.o. male with a history of urolithiasis. Their stone burden included 1.6x1.1cm right renal pelvis stone. After a thorough discussion of the risks, benefits, and alternatives of the surgery, pt agreed to proceed.  In the morning of surgery, pt went down to Interventional Radiology to get an access placed and nephroscopy films were reviewed prior to surgery today. They had good access with a 5-French nephroureteral catheter going all the way down to the bladder.  Description of procedure: After informed consent was obtained from the patient, the patient was identified, the patient was brought to the operating room and placed in supine  position.   General anesthesia was administered as well as perioperative IV antibiotics with 2g of ampicillin and 552mkg of gentamicin.  At the beginning of the case, a time-out was performed to properly identify the patient, the surgery to be performed, and the surgical site. Sequential compression devices were applied to lower extremities at the beginning of the case for DVT prophylaxis.   We placed a Foley sterilely with pt in the supine position.    The patient was then placed into a prone position onto gel rolls, making sure that all pressure points were properly padded.  The patient's right flank and genitalia were then prepped and draped in sterile fashion.    A superstiff wire was passed through the 4-French open-ended catheter down to the bladder, which we could see under fluoroscopy. We made an incision around this wire. We then dilated the tract using a dual lumen. An antegrade nephrostogram was performed which demonstrated filling of contrast into the collecting system. We then passed a 0.038 Sensor wire down to the bladder. This did have some resistance however presumed to be due to indwelling stent placement.   The Bard X force balloon was then passed over the superstiff wire until the radiopaque tip was well into the lower pole calyx.  We dilated this balloon to 18 cm of water pressure.  We advanced the sheath over the balloon.  We deflated the balloon, leaving the wire in place.   The rigid nephroscope and the Trilogy were inserted into the kidney. The stone burden was encountered. We also encountered clots. A grasper was used to remove the right ureteral stent. The Trilogy was used to fragement and suction out the stone. The Perc N-circle was used to remove  large fragments. All fragments were removed.   Flexible nephroscopy was performed revealing no further fragments. Repeat retrograde pyeloscopy was performed using the flexible nephroscope. No further stones were noted. At this point,  I tried to navigate the ureteroscope down the renal pelvis; however, realized that the wires were submucosal.   At this point, I re-prepped from below and navigated flexible cystoscope into the bladder.  I examined the distal right ureter and saw 2 wires protruding from the right ureter.  I then used a flexible ureteroscope from below and navigate this up the distal right ureter where I encountered evidence of both these wires being submucosal and entering the true lumen of the distal right ureter.  I then passed a sensor wire through the true lumen in a retrograde manner up into the right upper pole.  I then passed a 5 [place] open-ended catheter over this wire and performed a right retrograde pyelogram demonstrating no extravasation of contrast, no hydronephrosis.  I then replaced the sensor wire.  I did attempt to place a retrograde stent however this was met with some difficulty.  The stent was advanced too far proximally.  I then navigated a nephroscope remove part of the stent through the access sheath, replaced a sensor wire into the bladder and then readvanced a ureteral stent into the bladder.  I confirmed that we had a curl within the bladder using a flexible cystoscope.  The proximal curl was in the renal pelvis.  I removed the 2 other wires which were submucosal.  I then advanced a separate 0.038 sensor wire into the renal pelvis. We then advanced a 22-Fr council tip catheter over the sensor wire into the renal pelvis. The sheath was removed. There was no significant bleeding. A repeat antegrade nephrostogram was performed demonstrating no filling defects and no extravasation of contrast. The balloon was filled with 3cc of sterile water with contrast.   We then sutured the Councill tip catheter into position using a couple of 0 silk sutures.  The incision was closed with a 2-0 vicryl suture. Sterile 4x4's and ABD pad were placed around the nephrostomy tube.  Several large OpSite dressings were  placed over this.    At this point, the procedure was completed. He was then transferred to the supine position and recovery room in stable condition. The patient tolerated the procedure well.  There were no immediate complications.  Plan:  Patient will be observed overnight. CT A/P non-con will be obtained to assess remaining stone burden. We will plan to clamp the nephrostomy tube tomorrow and remove prior to discharge tomorrow if no increased pain or fevers.  Matt R. Weeki [facility]  Pager: [phone]

## 2020-07-03 ENCOUNTER — Inpatient Hospital Stay (HOSPITAL_COMMUNITY): Payer: 59

## 2020-07-03 ENCOUNTER — Encounter (HOSPITAL_COMMUNITY): Payer: Self-pay | Admitting: Urology

## 2020-07-03 LAB — CBC
HCT: 39.4 % (ref 39.0–52.0)
Hemoglobin: 12.9 g/dL — ABNORMAL LOW (ref 13.0–17.0)
MCH: 30.1 pg (ref 26.0–34.0)
MCHC: 32.7 g/dL (ref 30.0–36.0)
MCV: 91.8 fL (ref 80.0–100.0)
Platelets: 310 10*3/uL (ref 150–400)
RBC: 4.29 MIL/uL (ref 4.22–5.81)
RDW: 13 % (ref 11.5–15.5)
WBC: 12.6 10*3/uL — ABNORMAL HIGH (ref 4.0–10.5)
nRBC: 0 % (ref 0.0–0.2)

## 2020-07-03 LAB — GLUCOSE, CAPILLARY
Glucose-Capillary: 111 mg/dL — ABNORMAL HIGH (ref 70–99)
Glucose-Capillary: 117 mg/dL — ABNORMAL HIGH (ref 70–99)
Glucose-Capillary: 137 mg/dL — ABNORMAL HIGH (ref 70–99)
Glucose-Capillary: 145 mg/dL — ABNORMAL HIGH (ref 70–99)
Glucose-Capillary: 98 mg/dL (ref 70–99)

## 2020-07-03 LAB — BASIC METABOLIC PANEL
Anion gap: 8 (ref 5–15)
BUN: 14 mg/dL (ref 6–20)
CO2: 28 mmol/L (ref 22–32)
Calcium: 8.9 mg/dL (ref 8.9–10.3)
Chloride: 101 mmol/L (ref 98–111)
Creatinine, Ser: 0.96 mg/dL (ref 0.61–1.24)
GFR, Estimated: >60 mL/min (ref >60)
Glucose, Bld: 119 mg/dL — ABNORMAL HIGH (ref 70–99)
Potassium: 4.1 mmol/L (ref 3.5–5.1)
Sodium: 137 mmol/L (ref 135–145)

## 2020-07-03 MED ORDER — METHYLENE BLUE 0.5 % INJ SOLN
3.0000 mL | INTRAVENOUS | Status: AC
  Administered 2020-07-03: 15 mg
  Filled 2020-07-03: qty 3

## 2020-07-03 MED ORDER — SODIUM CHLORIDE 0.45 % IV SOLN: INTRAVENOUS | Status: DC

## 2020-07-03 MED ORDER — CHLORHEXIDINE GLUCONATE CLOTH 2 % EX PADS
6.0000 | MEDICATED_PAD | Freq: Every day | CUTANEOUS | Status: DC
  Administered 2020-07-03 – 2020-07-05 (×3): 6 via TOPICAL

## 2020-07-03 NOTE — Progress Notes (Signed)
1 Day Post-Op Subjective: Pain controlled, no nausea or emesis. Tolerating tubes.  Objective: Vital signs in last 24 hours: Temp:  [97.8 F (36.6 C)-98.4 F (36.9 C)] 97.9 F (36.6 C) (04/20 0415) Pulse Rate:  [62-114] 69 (04/20 0415) Resp:  [0-20] 18 (04/20 0415) BP: (93-156)/(54-107) 139/90 (04/20 0415) SpO2:  [92 %-100 %] 98 % (04/20 0415) Weight:  [113.2 kg] 113.2 kg (04/19 1637)  Intake/Output from previous day: 04/19 0701 - 04/20 0700 In: 1928.1 [P.O.:480; I.V.:1236.8; IV Piggyback:211.3] Out: 1650 [Urine:1050; Drains:550; Blood:50] Intake/Output this shift: Total I/O In: 1367.6 [P.O.:360; I.V.:1007.6] Out: 1050 [Urine:500; Drains:550]  Physical Exam:  General: Alert and oriented CV: RRR Lungs: Clear Abdomen: Soft, ND, right flank ATTP; R PCN light pink; foley clear Ext: NT, No erythema  Lab Results: Recent Labs    07/02/20 1454 07/03/20 0442  HGB 13.9 12.9*  HCT 42.3 39.4   BMET Recent Labs    07/02/20 1454 07/03/20 0442  NA 139 137  K 4.7 4.1  CL 106 101  CO2 23 28  GLUCOSE 183* 119*  BUN 17 14  CREATININE 1.30* 0.96  CALCIUM 8.8* 8.9     Studies/Results: CT ABDOMEN PELVIS WO CONTRAST  Result Date: 07/03/2020 CLINICAL DATA:  Urinary tract calculus with stent. Postsurgical exam EXAM: CT ABDOMEN AND PELVIS WITHOUT CONTRAST TECHNIQUE: Multidetector CT imaging of the abdomen and pelvis was performed following the standard protocol without IV contrast. COMPARISON:  CT 05/09/2020, fluoroscopy 07/02/2020 FINDINGS: Lower chest: Bandlike opacities in the lung bases, likely subsegmental atelectasis. Normal heart size. No pericardial effusion. Hepatobiliary: Diffuse hepatic hypoattenuation compatible with hepatic steatosis. Sparing along the gallbladder fossa. No visible focal liver lesion. Smooth liver surface contour. Normal gallbladder and biliary tree without visible calcified gallstone or significant biliary ductal dilatation. Pancreas: No pancreatic  ductal dilatation or surrounding inflammatory changes. Spleen: Normal in size. No concerning splenic lesions. Small accessory splenules. Normal adrenal glands. Adrenals/Urinary Tract: Normal adrenal glands. A right percutaneous nephrostomy tube is positioned in the right kidney with the tip quite peripheral within a lower pole calyx and side ports side ports retracted into portion of the renal cortex (2/50) which may reflect some slight interval retraction from fluoroscopic images. A double-J right nephroureteral stent appears appropriately positioned with the proximal pigtail in the renal pelvis and distal pigtail within the bladder lumen. Foley catheter is in place. Urinary bladder largely contracted around the inflated Foley catheter balloon with small amount of intraluminal gas likely related to this instrumentation. Minimal residual dilatation of the right renal pelvis. Some slight periureteral and perinephric stranding centered upon the renal pelvis and retroperitoneum is likely related to instrumentation procedure. No visible calculi remain within the urinary tract. No visible or contour deforming renal lesion. Stomach/Bowel: Distal esophagus, stomach and duodenal sweep are unremarkable. No small bowel wall thickening or dilatation. No evidence of obstruction. The appendix is surgically absent. No colonic dilatation or wall thickening. Vascular/Lymphatic: No significant vascular findings are present. No enlarged abdominal or pelvic lymph nodes. Reproductive: The prostate and seminal vesicles are unremarkable. Other: Reactive appearing changes about the right kidney and in the right retroperitoneum along the course of the ureter. No free intraperitoneal fluid or air. No bowel containing hernias. Musculoskeletal: Multilevel degenerative changes are present in the imaged portions of the spine. Stable anterior wedging at T1. No acute osseous abnormality or suspicious osseous lesion. IMPRESSION: 1. Right  percutaneous nephrostomy tube is positioned in the right kidney with the tip quite peripheral within a lower pole calyx  and side ports retracted into portion of the renal cortex. Could reflect some mild interval retraction from recent fluoroscopic image. Double-J nephroureteral stent is in place on the right as well with some perinephric and periureteral stranding likely related to instrumentation. Minimal residual dilatation of the right renal pelvis. 2. Hepatic steatosis. Electronically Signed   By: Kreg Shropshire M.D.   On: 07/03/2020 05:04   DG Chest Port 1 View  Result Date: 07/02/2020 CLINICAL DATA:  Right pneumothorax. EXAM: PORTABLE CHEST 1 VIEW COMPARISON:  September 12, 2013. FINDINGS: Stable cardiomediastinal silhouette. No pneumothorax or pleural effusion is noted. Left lung is clear. Mild right basilar subsegmental atelectasis is noted. Bony thorax is unremarkable. IMPRESSION: No definite pneumothorax is noted. Mild right basilar subsegmental atelectasis is noted. Electronically Signed   By: Lupita Raider M.D.   On: 07/02/2020 15:18   DG C-Arm 1-60 Min-No Report  Result Date: 07/02/2020 Fluoroscopy was utilized by the requesting physician.  No radiographic interpretation.   IR URETERAL STENT RIGHT NEW ACCESS W/O SEP NEPHROSTOMY CATH  Result Date: 07/02/2020 INDICATION: Obstructing right UPJ stone EXAM: ULTRASOUND and FLUOROSCOPIC RIGHT NEPHROSTOMY ACCESS FOR OPERATIVE NEPHROLITHOTOMY COMPARISON:  05/09/2020 MEDICATIONS: 2 G ROCEPHIN; The antibiotic was administered in an appropriate time frame prior to skin puncture. ANESTHESIA/SEDATION: Fentanyl 100 mcg IV; Versed 3.0 mg IV Moderate Sedation Time:  24 MINUTES The patient was continuously monitored during the procedure by the interventional radiology nurse under my direct supervision. CONTRAST:  20 cc-administered into the collecting system(s) FLUOROSCOPY TIME:  Fluoroscopy Time: 11 minutes 42 seconds (263 mGy). COMPLICATIONS: None immediate.  PROCEDURE: Informed written consent was obtained from the patient after a thorough discussion of the procedural risks, benefits and alternatives. All questions were addressed. Maximal Sterile Barrier Technique was utilized including caps, mask, sterile gowns, sterile gloves, sterile drape, hand hygiene and skin antiseptic. A timeout was performed prior to the initiation of the procedure. Previous imaging reviewed. Patient positioned prone. Preliminary ultrasound performed. The right kidney was localized and marked. Under sterile conditions and local anesthesia, 21 gauge needle was advanced into a dilated mid to lower pole calyx. There was return of clear urine. Needle position confirmed with ultrasound. Images obtained for documentation. 018 guidewire advanced followed by the transitional dilator set. Bentson guidewire coiled in the upper pole dilated collecting system. Kumpe catheter advanced. With a Glidewire, access was eventually advanced past the obstructing UPJ calculus and the internal ureteral stent. Amplatz guidewire and catheter access advanced into the bladder. Contrast injection confirms position in the bladder. Images obtained for documentation. IMPRESSION: Successful ultrasound fluoroscopic right nephrostomy access advanced into the bladder for operative nephrolithotomy later today. Electronically Signed   By: Judie Petit.  Shick M.D.   On: 07/02/2020 12:08    Assessment/Plan: 1. Right renal stone s/p R PCNL 07/02/2020  -Cap R PCN tube. Plan to remove if no increased pain or fevers prior to discharge. -Void trial -Labs appropriate -CT without residual stones -Leave stent in place for 3 weeks. Will arrange followup. -Anticipate discharge home today if passes R PCN clamp trial.   LOS: 1 day   Matt R. Shakyra Mattera MD 07/03/2020, 6:52 AM [facility]  Pager: [phone]

## 2020-07-03 NOTE — Progress Notes (Signed)
Nephrostomy tube clamped at 0735, pt currently c/o pain rated at 10/10.  Given dilaudid 0.5mg  per order, and Dr. Cardell Peach notified. Nephrostomy tube unclamped and placed to gravity drain per MD. Will continue to monitor pt. Yasamin Karel, Yancey Flemings, RN

## 2020-07-03 NOTE — Progress Notes (Signed)
1 Day Post-Op Subjective: Developed increased severe right flank pain after clamping R PCN. This was put back to gravity. No nausea or emesis. Afebrile.  Objective: Vital signs in last 24 hours: Temp:  [97.8 F (36.6 C)-98.7 F (37.1 C)] 98.7 F (37.1 C) (04/20 1205) Pulse Rate:  [69-114] 82 (04/20 1205) Resp:  [0-20] 19 (04/20 1205) BP: (93-141)/(54-92) 119/81 (04/20 1205) SpO2:  [92 %-98 %] 94 % (04/20 1205) Weight:  [113.2 kg] 113.2 kg (04/19 1637)  Intake/Output from previous day: 04/19 0701 - 04/20 0700 In: 1928.1 [P.O.:480; I.V.:1236.8; IV Piggyback:211.3] Out: 1650 [Urine:1050; Drains:550; Blood:50] Intake/Output this shift: Total I/O In: -  Out: 1075 [Urine:700; Drains:375]   Foley: clear yellow R PCN: dark pink  Physical Exam:  General: Alert and oriented CV: RRR Lungs: Clear Abdomen: Soft, ND, Appropriately tender right flank Ext: NT, No erythema  Lab Results: Recent Labs    07/02/20 1454 07/03/20 0442  HGB 13.9 12.9*  HCT 42.3 39.4   BMET Recent Labs    07/02/20 1454 07/03/20 0442  NA 139 137  K 4.7 4.1  CL 106 101  CO2 23 28  GLUCOSE 183* 119*  BUN 17 14  CREATININE 1.30* 0.96  CALCIUM 8.8* 8.9     Studies/Results: CT ABDOMEN PELVIS WO CONTRAST  Result Date: 07/03/2020 CLINICAL DATA:  Urinary tract calculus with stent. Postsurgical exam EXAM: CT ABDOMEN AND PELVIS WITHOUT CONTRAST TECHNIQUE: Multidetector CT imaging of the abdomen and pelvis was performed following the standard protocol without IV contrast. COMPARISON:  CT 05/09/2020, fluoroscopy 07/02/2020 FINDINGS: Lower chest: Bandlike opacities in the lung bases, likely subsegmental atelectasis. Normal heart size. No pericardial effusion. Hepatobiliary: Diffuse hepatic hypoattenuation compatible with hepatic steatosis. Sparing along the gallbladder fossa. No visible focal liver lesion. Smooth liver surface contour. Normal gallbladder and biliary tree without visible calcified  gallstone or significant biliary ductal dilatation. Pancreas: No pancreatic ductal dilatation or surrounding inflammatory changes. Spleen: Normal in size. No concerning splenic lesions. Small accessory splenules. Normal adrenal glands. Adrenals/Urinary Tract: Normal adrenal glands. A right percutaneous nephrostomy tube is positioned in the right kidney with the tip quite peripheral within a lower pole calyx and side ports side ports retracted into portion of the renal cortex (2/50) which may reflect some slight interval retraction from fluoroscopic images. A double-J right nephroureteral stent appears appropriately positioned with the proximal pigtail in the renal pelvis and distal pigtail within the bladder lumen. Foley catheter is in place. Urinary bladder largely contracted around the inflated Foley catheter balloon with small amount of intraluminal gas likely related to this instrumentation. Minimal residual dilatation of the right renal pelvis. Some slight periureteral and perinephric stranding centered upon the renal pelvis and retroperitoneum is likely related to instrumentation procedure. No visible calculi remain within the urinary tract. No visible or contour deforming renal lesion. Stomach/Bowel: Distal esophagus, stomach and duodenal sweep are unremarkable. No small bowel wall thickening or dilatation. No evidence of obstruction. The appendix is surgically absent. No colonic dilatation or wall thickening. Vascular/Lymphatic: No significant vascular findings are present. No enlarged abdominal or pelvic lymph nodes. Reproductive: The prostate and seminal vesicles are unremarkable. Other: Reactive appearing changes about the right kidney and in the right retroperitoneum along the course of the ureter. No free intraperitoneal fluid or air. No bowel containing hernias. Musculoskeletal: Multilevel degenerative changes are present in the imaged portions of the spine. Stable anterior wedging at T1. No acute  osseous abnormality or suspicious osseous lesion. IMPRESSION: 1. Right percutaneous  nephrostomy tube is positioned in the right kidney with the tip quite peripheral within a lower pole calyx and side ports retracted into portion of the renal cortex. Could reflect some mild interval retraction from recent fluoroscopic image. Double-J nephroureteral stent is in place on the right as well with some perinephric and periureteral stranding likely related to instrumentation. Minimal residual dilatation of the right renal pelvis. 2. Hepatic steatosis. Electronically Signed   By: Kreg Shropshire M.D.   On: 07/03/2020 05:04   DG Chest Port 1 View  Result Date: 07/02/2020 CLINICAL DATA:  Right pneumothorax. EXAM: PORTABLE CHEST 1 VIEW COMPARISON:  September 12, 2013. FINDINGS: Stable cardiomediastinal silhouette. No pneumothorax or pleural effusion is noted. Left lung is clear. Mild right basilar subsegmental atelectasis is noted. Bony thorax is unremarkable. IMPRESSION: No definite pneumothorax is noted. Mild right basilar subsegmental atelectasis is noted. Electronically Signed   By: Lupita Raider M.D.   On: 07/02/2020 15:18   DG C-Arm 1-60 Min-No Report  Result Date: 07/02/2020 Fluoroscopy was utilized by the requesting physician.  No radiographic interpretation.   IR URETERAL STENT RIGHT NEW ACCESS W/O SEP NEPHROSTOMY CATH  Result Date: 07/02/2020 INDICATION: Obstructing right UPJ stone EXAM: ULTRASOUND and FLUOROSCOPIC RIGHT NEPHROSTOMY ACCESS FOR OPERATIVE NEPHROLITHOTOMY COMPARISON:  05/09/2020 MEDICATIONS: 2 G ROCEPHIN; The antibiotic was administered in an appropriate time frame prior to skin puncture. ANESTHESIA/SEDATION: Fentanyl 100 mcg IV; Versed 3.0 mg IV Moderate Sedation Time:  24 MINUTES The patient was continuously monitored during the procedure by the interventional radiology nurse under my direct supervision. CONTRAST:  20 cc-administered into the collecting system(s) FLUOROSCOPY TIME:  Fluoroscopy  Time: 11 minutes 42 seconds (263 mGy). COMPLICATIONS: None immediate. PROCEDURE: Informed written consent was obtained from the patient after a thorough discussion of the procedural risks, benefits and alternatives. All questions were addressed. Maximal Sterile Barrier Technique was utilized including caps, mask, sterile gowns, sterile gloves, sterile drape, hand hygiene and skin antiseptic. A timeout was performed prior to the initiation of the procedure. Previous imaging reviewed. Patient positioned prone. Preliminary ultrasound performed. The right kidney was localized and marked. Under sterile conditions and local anesthesia, 21 gauge needle was advanced into a dilated mid to lower pole calyx. There was return of clear urine. Needle position confirmed with ultrasound. Images obtained for documentation. 018 guidewire advanced followed by the transitional dilator set. Bentson guidewire coiled in the upper pole dilated collecting system. Kumpe catheter advanced. With a Glidewire, access was eventually advanced past the obstructing UPJ calculus and the internal ureteral stent. Amplatz guidewire and catheter access advanced into the bladder. Contrast injection confirms position in the bladder. Images obtained for documentation. IMPRESSION: Successful ultrasound fluoroscopic right nephrostomy access advanced into the bladder for operative nephrolithotomy later today. Electronically Signed   By: Judie Petit.  Shick M.D.   On: 07/02/2020 12:08    Assessment/Plan: 1. Right renal stone s/p R PCNL 07/02/2020  -Pt failed PCN clamp trial. Placed to gravity with resolved pain. Draining dark pink urine.  -Reviewed CT A/P. Right renal stent appears to be in appropriate position. -Will plan to observe today and repeat clamp trial tomorrow AM. -If fails again, may need to plan for cysto, retrograde pyelogram to ensure adequate stent position  -NPO after midnight -Discussed case with patient and wife including submucosal access  alongside ureter. Discussed importance of ensuring adequate right stent position.    LOS: 1 day   Matt R. Jersey Espinoza MD 07/03/2020, 1:10 PM [facility]  Pager: [phone]

## 2020-07-03 NOTE — Anesthesia Postprocedure Evaluation (Signed)
Anesthesia Post Note  Patient: Kaige Whistler  Procedure(s) Performed: NEPHROLITHOTOMY PERCUTANEOUS/ URETEROSCOPY/ STENT PLACMENT/ ANTEGRADE NEPHROSTOGRAM (Right )     Patient location during evaluation: PACU Anesthesia Type: General Level of consciousness: awake and alert Pain management: pain level controlled Vital Signs Assessment: post-procedure vital signs reviewed and stable Respiratory status: spontaneous breathing, nonlabored ventilation, respiratory function stable and patient connected to nasal cannula oxygen Cardiovascular status: blood pressure returned to baseline and stable Postop Assessment: no apparent nausea or vomiting Anesthetic complications: no   No complications documented.  Last Vitals:  Vitals:   07/02/20 1604 07/03/20 0415  BP: (!) 141/92 139/90  Pulse: 93 69  Resp: 18 18  Temp: 36.9 C 36.6 C  SpO2: 93% 98%    Last Pain:  Vitals:   07/03/20 0800  TempSrc:   PainSc: 10-Worst pain ever                 Aamya Orellana

## 2020-07-03 NOTE — Plan of Care (Signed)
  Problem: Education: Goal: Knowledge of General Education information will improve Description: Including pain rating scale, medication(s)/side effects and non-pharmacologic comfort measures Outcome: Completed/Met

## 2020-07-04 LAB — CBC WITH DIFFERENTIAL/PLATELET
Abs Immature Granulocytes: 0.04 10*3/uL (ref 0.00–0.07)
Basophils Absolute: 0.1 10*3/uL (ref 0.0–0.1)
Basophils Relative: 1 %
Eosinophils Absolute: 0.1 10*3/uL (ref 0.0–0.5)
Eosinophils Relative: 1 %
HCT: 38.9 % — ABNORMAL LOW (ref 39.0–52.0)
Hemoglobin: 12.4 g/dL — ABNORMAL LOW (ref 13.0–17.0)
Immature Granulocytes: 0 %
Lymphocytes Relative: 26 %
Lymphs Abs: 2.4 10*3/uL (ref 0.7–4.0)
MCH: 29.6 pg (ref 26.0–34.0)
MCHC: 31.9 g/dL (ref 30.0–36.0)
MCV: 92.8 fL (ref 80.0–100.0)
Monocytes Absolute: 0.7 10*3/uL (ref 0.1–1.0)
Monocytes Relative: 8 %
Neutro Abs: 6.1 10*3/uL (ref 1.7–7.7)
Neutrophils Relative %: 64 %
Platelets: 274 10*3/uL (ref 150–400)
RBC: 4.19 MIL/uL — ABNORMAL LOW (ref 4.22–5.81)
RDW: 13.1 % (ref 11.5–15.5)
WBC: 9.4 10*3/uL (ref 4.0–10.5)
nRBC: 0 % (ref 0.0–0.2)

## 2020-07-04 LAB — BASIC METABOLIC PANEL
Anion gap: 8 (ref 5–15)
BUN: 15 mg/dL (ref 6–20)
CO2: 29 mmol/L (ref 22–32)
Calcium: 9 mg/dL (ref 8.9–10.3)
Chloride: 104 mmol/L (ref 98–111)
Creatinine, Ser: 1.07 mg/dL (ref 0.61–1.24)
GFR, Estimated: >60 mL/min (ref >60)
Glucose, Bld: 108 mg/dL — ABNORMAL HIGH (ref 70–99)
Potassium: 4 mmol/L (ref 3.5–5.1)
Sodium: 141 mmol/L (ref 135–145)

## 2020-07-04 LAB — GLUCOSE, CAPILLARY
Glucose-Capillary: 105 mg/dL — ABNORMAL HIGH (ref 70–99)
Glucose-Capillary: 122 mg/dL — ABNORMAL HIGH (ref 70–99)
Glucose-Capillary: 172 mg/dL — ABNORMAL HIGH (ref 70–99)
Glucose-Capillary: 84 mg/dL (ref 70–99)
Glucose-Capillary: 98 mg/dL (ref 70–99)

## 2020-07-04 MED ORDER — SODIUM CHLORIDE 0.45 % IV SOLN: INTRAVENOUS | Status: DC

## 2020-07-04 NOTE — Progress Notes (Signed)
Dr Cardell Peach notified that patient is resting.  Patient received 4mg  Zofran & 0.5mg  Dilaudid after removal of right nephrostomy tube. Will keep patient NPO per MD. , RN

## 2020-07-04 NOTE — Plan of Care (Signed)
  Problem: Clinical Measurements: Goal: Postoperative complications will be avoided or minimized Outcome: Progressing   

## 2020-07-04 NOTE — Progress Notes (Signed)
Pt tolerated R PCN clamp trial for 6 hours without increased pain. R PCN removed. New dressing placed. Will observe for another hour or so to ensure does not develop pain or fevers.   Matt R. Kishaun Erekson MD [facility]  Pager: [phone]

## 2020-07-04 NOTE — Progress Notes (Signed)
Patient feeling better. C/o expected postop right flank pain. No fevers, chills, nausea. Voiding without difficulty. Ok for diet now. NPO after MN. Check renal ultrasound in AM to eval for hydro. Anticipate discharge home tomorrow.  Matt R. Collan Schoenfeld MD [facility]  Pager: [phone]

## 2020-07-04 NOTE — Progress Notes (Signed)
2 Days Post-Op Subjective: PCN clamped at 6AM. Pt has had no increased pain since then. No nausea or emesis. Afebrile. Voiding without difficulty.  Objective: Vital signs in last 24 hours: Temp:  [97.7 F (36.5 C)-98.8 F (37.1 C)] 98.8 F (37.1 C) (04/21 0441) Pulse Rate:  [67-89] 67 (04/21 0441) Resp:  [18-19] 18 (04/21 0441) BP: (119-127)/(72-81) 121/72 (04/21 0441) SpO2:  [94 %-95 %] 94 % (04/21 0441)  Intake/Output from previous day: 04/20 0701 - 04/21 0700 In: 797.2 [P.O.:240; I.V.:557.2] Out: 2125 [Urine:1500; Drains:625] Intake/Output this shift: No intake/output data recorded.   Foley/void: 1.5L R PCN:   Physical Exam:  General: Alert and oriented CV: RRR Lungs: Clear Abdomen: Soft, ND, ATTP Ext: NT, No erythema  Lab Results: Recent Labs    07/02/20 1454 07/03/20 0442  HGB 13.9 12.9*  HCT 42.3 39.4   BMET Recent Labs    07/02/20 1454 07/03/20 0442  NA 139 137  K 4.7 4.1  CL 106 101  CO2 23 28  GLUCOSE 183* 119*  BUN 17 14  CREATININE 1.30* 0.96  CALCIUM 8.8* 8.9     Studies/Results: CT ABDOMEN PELVIS WO CONTRAST  Result Date: 07/03/2020 CLINICAL DATA:  Urinary tract calculus with stent. Postsurgical exam EXAM: CT ABDOMEN AND PELVIS WITHOUT CONTRAST TECHNIQUE: Multidetector CT imaging of the abdomen and pelvis was performed following the standard protocol without IV contrast. COMPARISON:  CT 05/09/2020, fluoroscopy 07/02/2020 FINDINGS: Lower chest: Bandlike opacities in the lung bases, likely subsegmental atelectasis. Normal heart size. No pericardial effusion. Hepatobiliary: Diffuse hepatic hypoattenuation compatible with hepatic steatosis. Sparing along the gallbladder fossa. No visible focal liver lesion. Smooth liver surface contour. Normal gallbladder and biliary tree without visible calcified gallstone or significant biliary ductal dilatation. Pancreas: No pancreatic ductal dilatation or surrounding inflammatory changes. Spleen: Normal in  size. No concerning splenic lesions. Small accessory splenules. Normal adrenal glands. Adrenals/Urinary Tract: Normal adrenal glands. A right percutaneous nephrostomy tube is positioned in the right kidney with the tip quite peripheral within a lower pole calyx and side ports side ports retracted into portion of the renal cortex (2/50) which may reflect some slight interval retraction from fluoroscopic images. A double-J right nephroureteral stent appears appropriately positioned with the proximal pigtail in the renal pelvis and distal pigtail within the bladder lumen. Foley catheter is in place. Urinary bladder largely contracted around the inflated Foley catheter balloon with small amount of intraluminal gas likely related to this instrumentation. Minimal residual dilatation of the right renal pelvis. Some slight periureteral and perinephric stranding centered upon the renal pelvis and retroperitoneum is likely related to instrumentation procedure. No visible calculi remain within the urinary tract. No visible or contour deforming renal lesion. Stomach/Bowel: Distal esophagus, stomach and duodenal sweep are unremarkable. No small bowel wall thickening or dilatation. No evidence of obstruction. The appendix is surgically absent. No colonic dilatation or wall thickening. Vascular/Lymphatic: No significant vascular findings are present. No enlarged abdominal or pelvic lymph nodes. Reproductive: The prostate and seminal vesicles are unremarkable. Other: Reactive appearing changes about the right kidney and in the right retroperitoneum along the course of the ureter. No free intraperitoneal fluid or air. No bowel containing hernias. Musculoskeletal: Multilevel degenerative changes are present in the imaged portions of the spine. Stable anterior wedging at T1. No acute osseous abnormality or suspicious osseous lesion. IMPRESSION: 1. Right percutaneous nephrostomy tube is positioned in the right kidney with the tip quite  peripheral within a lower pole calyx and side ports retracted into  portion of the renal cortex. Could reflect some mild interval retraction from recent fluoroscopic image. Double-J nephroureteral stent is in place on the right as well with some perinephric and periureteral stranding likely related to instrumentation. Minimal residual dilatation of the right renal pelvis. 2. Hepatic steatosis. Electronically Signed   By: Kreg Shropshire M.D.   On: 07/03/2020 05:04   DG Chest Port 1 View  Result Date: 07/02/2020 CLINICAL DATA:  Right pneumothorax. EXAM: PORTABLE CHEST 1 VIEW COMPARISON:  September 12, 2013. FINDINGS: Stable cardiomediastinal silhouette. No pneumothorax or pleural effusion is noted. Left lung is clear. Mild right basilar subsegmental atelectasis is noted. Bony thorax is unremarkable. IMPRESSION: No definite pneumothorax is noted. Mild right basilar subsegmental atelectasis is noted. Electronically Signed   By: Lupita Raider M.D.   On: 07/02/2020 15:18   DG C-Arm 1-60 Min-No Report  Result Date: 07/02/2020 Fluoroscopy was utilized by the requesting physician.  No radiographic interpretation.   IR URETERAL STENT RIGHT NEW ACCESS W/O SEP NEPHROSTOMY CATH  Result Date: 07/02/2020 INDICATION: Obstructing right UPJ stone EXAM: ULTRASOUND and FLUOROSCOPIC RIGHT NEPHROSTOMY ACCESS FOR OPERATIVE NEPHROLITHOTOMY COMPARISON:  05/09/2020 MEDICATIONS: 2 G ROCEPHIN; The antibiotic was administered in an appropriate time frame prior to skin puncture. ANESTHESIA/SEDATION: Fentanyl 100 mcg IV; Versed 3.0 mg IV Moderate Sedation Time:  24 MINUTES The patient was continuously monitored during the procedure by the interventional radiology nurse under my direct supervision. CONTRAST:  20 cc-administered into the collecting system(s) FLUOROSCOPY TIME:  Fluoroscopy Time: 11 minutes 42 seconds (263 mGy). COMPLICATIONS: None immediate. PROCEDURE: Informed written consent was obtained from the patient after a thorough  discussion of the procedural risks, benefits and alternatives. All questions were addressed. Maximal Sterile Barrier Technique was utilized including caps, mask, sterile gowns, sterile gloves, sterile drape, hand hygiene and skin antiseptic. A timeout was performed prior to the initiation of the procedure. Previous imaging reviewed. Patient positioned prone. Preliminary ultrasound performed. The right kidney was localized and marked. Under sterile conditions and local anesthesia, 21 gauge needle was advanced into a dilated mid to lower pole calyx. There was return of clear urine. Needle position confirmed with ultrasound. Images obtained for documentation. 018 guidewire advanced followed by the transitional dilator set. Bentson guidewire coiled in the upper pole dilated collecting system. Kumpe catheter advanced. With a Glidewire, access was eventually advanced past the obstructing UPJ calculus and the internal ureteral stent. Amplatz guidewire and catheter access advanced into the bladder. Contrast injection confirms position in the bladder. Images obtained for documentation. IMPRESSION: Successful ultrasound fluoroscopic right nephrostomy access advanced into the bladder for operative nephrolithotomy later today. Electronically Signed   By: Judie Petit.  Shick M.D.   On: 07/02/2020 12:08    Assessment/Plan: 1. Right renal stone s/p R PCNL 07/02/2020  -R PCN clamped at 6AM. Tolerating clamp trial well so far. Will plan to remove PCN if continues to tolerate with no increased pain or fevers. -If unable to tolerate clamp trial, may perform antegrade pyelogram, cysto, R RPG, R ureteral stent exchange -Remain NPO for now -F/u AM labs   LOS: 2 days   Matt R. Jamesrobert Ohanesian MD 07/04/2020, 7:08 AM [facility]  Pager: [phone]

## 2020-07-05 ENCOUNTER — Inpatient Hospital Stay (HOSPITAL_COMMUNITY): Payer: 59

## 2020-07-05 LAB — CBC WITH DIFFERENTIAL/PLATELET
Abs Immature Granulocytes: 0.02 10*3/uL (ref 0.00–0.07)
Basophils Absolute: 0.1 10*3/uL (ref 0.0–0.1)
Basophils Relative: 1 %
Eosinophils Absolute: 0.1 10*3/uL (ref 0.0–0.5)
Eosinophils Relative: 2 %
HCT: 40.3 % (ref 39.0–52.0)
Hemoglobin: 13.1 g/dL (ref 13.0–17.0)
Immature Granulocytes: 0 %
Lymphocytes Relative: 27 %
Lymphs Abs: 2.2 10*3/uL (ref 0.7–4.0)
MCH: 29.8 pg (ref 26.0–34.0)
MCHC: 32.5 g/dL (ref 30.0–36.0)
MCV: 91.6 fL (ref 80.0–100.0)
Monocytes Absolute: 0.7 10*3/uL (ref 0.1–1.0)
Monocytes Relative: 8 %
Neutro Abs: 5.2 10*3/uL (ref 1.7–7.7)
Neutrophils Relative %: 62 %
Platelets: 307 10*3/uL (ref 150–400)
RBC: 4.4 MIL/uL (ref 4.22–5.81)
RDW: 12.4 % (ref 11.5–15.5)
WBC: 8.3 10*3/uL (ref 4.0–10.5)
nRBC: 0 % (ref 0.0–0.2)

## 2020-07-05 LAB — GLUCOSE, CAPILLARY
Glucose-Capillary: 103 mg/dL — ABNORMAL HIGH (ref 70–99)
Glucose-Capillary: 114 mg/dL — ABNORMAL HIGH (ref 70–99)
Glucose-Capillary: 117 mg/dL — ABNORMAL HIGH (ref 70–99)
Glucose-Capillary: 150 mg/dL — ABNORMAL HIGH (ref 70–99)
Glucose-Capillary: 84 mg/dL (ref 70–99)
Glucose-Capillary: 201 mg/dL — ABNORMAL HIGH (ref 70–99)

## 2020-07-05 LAB — BASIC METABOLIC PANEL
Anion gap: 9 (ref 5–15)
BUN: 13 mg/dL (ref 6–20)
CO2: 27 mmol/L (ref 22–32)
Calcium: 9.3 mg/dL (ref 8.9–10.3)
Chloride: 106 mmol/L (ref 98–111)
Creatinine, Ser: 0.98 mg/dL (ref 0.61–1.24)
GFR, Estimated: >60 mL/min (ref >60)
Glucose, Bld: 113 mg/dL — ABNORMAL HIGH (ref 70–99)
Potassium: 4 mmol/L (ref 3.5–5.1)
Sodium: 142 mmol/L (ref 135–145)

## 2020-07-05 MED ORDER — HYDROCORTISONE 0.5 % EX CREA: 1.0000 "application " | TOPICAL_CREAM | Freq: Two times a day (BID) | CUTANEOUS | 0 refills | Status: DC

## 2020-07-05 NOTE — Discharge Instructions (Signed)
[facility] [phone] Post PCNL/Ureteroscopy With or Without Stent Instructions  Definitions:  Ureter: The duct that transports urine from the kidney to the bladder. Stent:   A plastic hollow tube that is placed into the ureter, from the kidney to the bladder to prevent the ureter from swelling shut.  GENERAL INSTRUCTIONS:  Despite the fact that no skin incisions were used, the area around the ureter and bladder is raw and irritated. The stent is a foreign body which will further irritate the bladder wall. This irritation is manifested by increased frequency of urination, both day and night, and by an increase in the urge to urinate. In some, the urge to urinate is present almost always. Sometimes the urge is strong enough that you may not be able to stop yourself from urinating. The only real cure is to remove the stent and then give time for the bladder wall to heal which can't be done until the danger of the ureter swelling shut has passed, which varies.  You may see some blood in your urine while the stent is in place and a few days afterwards. Do not be alarmed, even if the urine was clear for a while. Get off your feet and drink lots of fluids until clearing occurs. If you start to pass clots or don't improve, call us.  DIET: You may return to your normal diet immediately. Because of the raw surface of your bladder, alcohol, spicy foods, acid type foods and drinks with caffeine may cause irritation or frequency and should be used in moderation. To keep your urine flowing freely and to avoid constipation, drink plenty of fluids during the day ( 8-10 glasses ). Tip: Avoid cranberry juice because it is very acidic.  ACTIVITY: Your physical activity doesn't need to be restricted. However, if you are very active, you may see some blood in your urine. We suggest that you reduce your activity under these circumstances until the bleeding has stopped.  BOWELS: It is important  to keep your bowels regular during the postoperative period. Straining with bowel movements can cause bleeding. A bowel movement every other day is reasonable. Use a mild laxative if needed, such as Milk of Magnesia 2-3 tablespoons, or 2 Dulcolax tablets. Call if you continue to have problems. If you have been taking narcotics for pain, before, during or after your surgery, you may be constipated. Take a laxative if necessary.   MEDICATION: You should resume your pre-surgery medications unless told not to. In addition you will often be given an antibiotic to prevent infection. These should be taken as prescribed until the bottles are finished unless you are having an unusual reaction to one of the drugs.  PROBLEMS YOU SHOULD REPORT TO [place]:  Fevers over 100.5 Fahrenheit.  Heavy bleeding, or clots ( See above notes about blood in urine ).  Inability to urinate.  Drug reactions ( hives, rash, nausea, vomiting, diarrhea ).  Severe burning or pain with urination that is not improving.  FOLLOW-UP: You will need a follow-up appointment to monitor your progress. Call for this appointment at the number listed above. Usually the first appointment will be about three to fourteen days after your surgery.  Followup in 3 weeks as scheduled for right ureteral stent removal.

## 2020-07-05 NOTE — Progress Notes (Signed)
3 Days Post-Op Subjective: Pain well controlled. Denies nausea or emesis. Voiding without difficulty.  Objective: Vital signs in last 24 hours: Temp:  [98.2 F (36.8 C)-98.6 F (37 C)] 98.2 F (36.8 C) (04/22 0431) Pulse Rate:  [57-69] 57 (04/22 0431) Resp:  [13-18] 18 (04/22 0431) BP: (118-128)/(71-72) 124/72 (04/22 0431) SpO2:  [95 %-96 %] 95 % (04/22 0431)  Intake/Output from previous day: 04/21 0701 - 04/22 0700 In: 1203.5 [I.V.:1203.5] Out: -  Intake/Output this shift: No intake/output data recorded.  UOP: poorly recorded voids  Physical Exam:  General: Alert and oriented CV: RRR Lungs: Clear Abdomen: Soft, ND, ATTP; No CVA tenderness Ext: NT, No erythema  Lab Results: Recent Labs    07/03/20 0442 07/04/20 0737 07/05/20 0441  HGB 12.9* 12.4* 13.1  HCT 39.4 38.9* 40.3   BMET Recent Labs    07/04/20 0737 07/05/20 0441  NA 141 142  K 4.0 4.0  CL 104 106  CO2 29 27  GLUCOSE 108* 113*  BUN 15 13  CREATININE 1.07 0.98  CALCIUM 9.0 9.3     Studies/Results: US RENAL  Result Date: 07/05/2020 CLINICAL DATA:  Hydronephrosis. EXAM: RENAL / URINARY TRACT ULTRASOUND COMPLETE COMPARISON:  July 03, 2020. FINDINGS: Right Kidney: Renal measurements: 12.1 x 5.0 x 4.3 cm = volume: 136 mL. Echogenicity within normal limits. Mild hydronephrosis is noted. Proximal portion of ureteral stent is noted in right renal pelvis. No mass visualized. Left Kidney: Renal measurements: 11.7 x 6.4 x 5.8 cm = volume: 228 mL. Echogenicity within normal limits. No mass or hydronephrosis visualized. Bladder: Appears normal for degree of bladder distention. Bilateral ureteral jets are visualized. Other: None. IMPRESSION: Mild right hydronephrosis is noted. Proximal portion of right ureteral stent is noted in right renal pelvis. Electronically Signed   By: Lupita Raider M.D.   On: 07/05/2020 08:46    Assessment/Plan: 1. Right renal stone s/p R PCNL 07/02/2020  -Tolerated clamp trial and  PCN removal yesterday -RBUS this AM with stent in appropriate position in renal pelvis and ureteral jets bilaterally. Only minimal hydro expected from reflux.  -Labs reviewed, appropriate -Discharge home today. F/u in 3 weeks for stent removal. Will have schedulers call with appointment.   LOS: 3 days   Matt R. Macall Mccroskey MD 07/05/2020, 9:37 AM [facility]  Pager: [phone]

## 2020-07-05 NOTE — Discharge Summary (Signed)
Date of admission: 07/02/2020  Date of discharge: 07/05/2020  Admission diagnosis: Right renal stone  Discharge diagnosis: Right renal stone  Secondary diagnoses: none  History and Physical: For full details, please see admission history and physical. Briefly, Jordan Barnett is a 30 y.o. year old patient with a right renal stone s/p R PCNL on 07/02/2020.   Hospital Course: The patient recovered in the usual expected fashion.  He had his diet advanced slowly.  Initially managed with IV pain control, then transitioned to PO meds when he was tolerating oral intake.  His labs were stable throughout the hospital course. He had increased pain POD1 with clamp trial. This was repeated on POD2 and subsequently removed. He tolerated well. He did have some nausea after pull and stayed another night. On POD3, he is feeling well and appropriate for discharge.  He was discharged to home on POD#3.  At the time of discharge the patient was tolerating a regular diet, passing flatus, ambulating, had adequate pain control and was agreeable to discharge.  Follow up as scheduled.    Laboratory values: Recent Labs    07/03/20 0442 07/04/20 0737 07/05/20 0441  HGB 12.9* 12.4* 13.1  HCT 39.4 38.9* 40.3   Recent Labs    07/04/20 0737 07/05/20 0441  CREATININE 1.07 0.98    Disposition: Home  Discharge instruction: The patient was instructed to be ambulatory but told to refrain from heavy lifting, strenuous activity, or driving.   Discharge medications:  Allergies as of 07/05/2020      Reactions   Fenofibrate Micronized    Sexual dysfunc      Medication List    TAKE these medications   blood glucose meter kit and supplies Dispense based on patient and insurance preference. Use up to four times daily as directed. (FOR ICD-10 E10.9, E11.9).   cephALEXin 500 MG capsule Commonly known as: KEFLEX Take 1 capsule (500 mg total) by mouth 2 (two) times daily for 3 days. Take day prior, day of and day after  stent removal in the office (in about 2 weeks)   dextromethorphan-guaiFENesin 30-600 MG 12hr tablet Commonly known as: MUCINEX DM Take 1 tablet by mouth 2 (two) times daily as needed (congestion).   docusate sodium 100 MG capsule Commonly known as: Colace Take 1 capsule (100 mg total) by mouth daily as needed.   escitalopram 10 MG tablet Commonly known as: Lexapro Take 1 tablet (10 mg total) by mouth daily.   levothyroxine 100 MCG tablet Commonly known as: SYNTHROID TAKE ONE (1) TABLET EACH DAY What changed: See the new instructions.   lisinopril 10 MG tablet Commonly known as: ZESTRIL Take 1 tablet (10 mg total) by mouth daily.   Lumify 0.025 % Soln Generic drug: Brimonidine Tartrate Place 1 drop into both eyes daily as needed (redness).   metFORMIN 500 MG 24 hr tablet Commonly known as: Glucophage XR Take 1 tablet (500 mg total) by mouth daily with breakfast.   ondansetron 4 MG tablet Commonly known as: Zofran Take 1 tablet (4 mg total) by mouth every 8 (eight) hours as needed for nausea or vomiting.   oxyCODONE-acetaminophen 5-325 MG tablet Commonly known as: Percocet Take 1 tablet by mouth every 4 (four) hours as needed for up to 20 doses for severe pain.   rosuvastatin 5 MG tablet Commonly known as: CRESTOR Take 1 tablet (5 mg total) by mouth daily.       Followup:   Follow-up Information    Schedule an appointment as  soon as possible for a visit with [place].   Why: Office will call with appointment - 3 weeks for stent removal Contact information: [address] [place]. [facility]  Pager: [phone]

## 2020-07-08 ENCOUNTER — Ambulatory Visit: Payer: 59 | Admitting: Pharmacist

## 2020-07-16 ENCOUNTER — Other Ambulatory Visit: Payer: Self-pay

## 2020-07-16 ENCOUNTER — Ambulatory Visit: Payer: 59 | Admitting: Family

## 2020-07-16 ENCOUNTER — Encounter: Payer: Self-pay | Admitting: Family

## 2020-07-16 VITALS — BP 133/84 | HR 98 | Temp 97.2°F | Ht 70.0 in | Wt 249.2 lb

## 2020-07-16 DIAGNOSIS — Z09 Encounter for follow-up examination after completed treatment for conditions other than malignant neoplasm: Secondary | ICD-10-CM | POA: Diagnosis not present

## 2020-07-16 DIAGNOSIS — E1169 Type 2 diabetes mellitus with other specified complication: Secondary | ICD-10-CM

## 2020-07-16 DIAGNOSIS — E1159 Type 2 diabetes mellitus with other circulatory complications: Secondary | ICD-10-CM | POA: Diagnosis not present

## 2020-07-16 DIAGNOSIS — I152 Hypertension secondary to endocrine disorders: Secondary | ICD-10-CM | POA: Diagnosis not present

## 2020-07-16 NOTE — Progress Notes (Signed)
Subjective:    Patient ID: Treyvin Glidden, male    DOB: 1990/07/20, 30 y.o.   MRN: [id number]  Chief Complaint  Patient presents with  . Diabetes    1 mth follow up has not taken meds today    PT presents to the office today to recheck DM and HTN. PT's BP is at goal today. He was seen on 07/02/20 for nephrolithotomy.  Diabetes He presents for his follow-up diabetic visit. He has type 2 diabetes mellitus. There are no hypoglycemic associated symptoms. Associated symptoms include blurred vision. Pertinent negatives for diabetes include no foot paresthesias. Symptoms are stable. Pertinent negatives for diabetic complications include no CVA or heart disease. Risk factors for coronary artery disease include dyslipidemia, diabetes mellitus, hypertension, male sex and sedentary lifestyle. He is following a generally unhealthy diet. His overall blood glucose range is 110-130 mg/dl. Eye exam is current.  Hypertension This is a chronic problem. The current episode started more than 1 year ago. The problem has been waxing and waning since onset. The problem is controlled. Associated symptoms include blurred vision and malaise/fatigue. Pertinent negatives include no peripheral edema or shortness of breath. The current treatment provides moderate improvement. There is no history of CVA.      Review of Systems  Constitutional: Positive for malaise/fatigue.  Eyes: Positive for blurred vision.  Respiratory: Negative for shortness of breath.   All other systems reviewed and are negative.  Family History  Problem Relation Age of Onset  . Hypertension Mother   . Diabetes Father    Social History   Socioeconomic History  . Marital status: Married    Spouse name: Not on file  . Number of children: Not on file  . Years of education: Not on file  . Highest education level: Not on file  Occupational History  . Not on file  Tobacco Use  . Smoking status: Former Smoker    Packs/day: 1.00    Years:  10.00    Pack years: 10.00    Quit date: 03/16/2013    Years since quitting: 7.3  . Smokeless tobacco: Former Neurosurgeon    Types: Snuff    Quit date: 05/27/2020  Vaping Use  . Vaping Use: Never used  Substance and Sexual Activity  . Alcohol use: No  . Drug use: No  . Sexual activity: Yes  Other Topics Concern  . Not on file  Social History Narrative  . Not on file   Social Determinants of Health   Financial Resource Strain: Not on file  Food Insecurity: Not on file  Transportation Needs: Not on file  Physical Activity: Not on file  Stress: Not on file  Social Connections: Not on file       Objective:   Physical Exam Vitals reviewed.  Constitutional:      General: He is not in acute distress.    Appearance: He is well-developed.  HENT:     Head: Normocephalic.     Right Ear: Tympanic membrane normal.     Left Ear: Tympanic membrane normal.  Eyes:     General:        Right eye: No discharge.        Left eye: No discharge.     Pupils: Pupils are equal, round, and reactive to light.  Neck:     Thyroid: No thyromegaly.  Cardiovascular:     Rate and Rhythm: Normal rate and regular rhythm.     Heart sounds: Normal heart sounds. No murmur  heard.   Pulmonary:     Effort: Pulmonary effort is normal. No respiratory distress.     Breath sounds: Normal breath sounds. No wheezing.  Abdominal:     General: Bowel sounds are normal. There is no distension.     Palpations: Abdomen is soft.     Tenderness: There is no abdominal tenderness.  Musculoskeletal:        General: No tenderness. Normal range of motion.     Cervical back: Normal range of motion and neck supple.  Skin:    General: Skin is warm and dry.     Findings: No erythema or rash.  Neurological:     Mental Status: He is alert and oriented to person, place, and time.     Cranial Nerves: No cranial nerve deficit.     Deep Tendon Reflexes: Reflexes are normal and symmetric.  Psychiatric:        Behavior: Behavior  normal.        Thought Content: Thought content normal.        Judgment: Judgment normal.          BP 133/84   Pulse 98   Temp (!) 97.2 F (36.2 C) (Temporal)   Ht 5\' 10"  (1.778 m)   Wt 249 lb 3.2 oz (113 kg)   BMI 35.76 kg/m   Assessment & Plan:  Gaurav Baldree comes in today with chief complaint of Diabetes (1 mth follow up has not taken meds today )   Diagnosis and orders addressed:  1. Type 2 diabetes mellitus with other specified complication, without long-term current use of insulin (HCC)  2. Hypertension associated with diabetes (HCC)  3. Hospital discharge follow-up   Labs reviewed  Health Maintenance reviewed Diet and exercise encouraged  Follow up plan: 3 months and make appointment with Clinical Pharm   Thomes Lolling, FNP

## 2020-07-16 NOTE — Patient Instructions (Signed)
Diabetes Mellitus and Nutrition, Adult When you have diabetes, or diabetes mellitus, it is very important to have healthy eating habits because your blood sugar (glucose) levels are greatly affected by what you eat and drink. Eating healthy foods in the right amounts, at about the same times every day, can help you:  Control your blood glucose.  Lower your risk of heart disease.  Improve your blood pressure.  Reach or maintain a healthy weight. What can affect my meal plan? Every person with diabetes is different, and each person has different needs for a meal plan. Your health care provider may recommend that you work with a dietitian to make a meal plan that is best for you. Your meal plan may vary depending on factors such as:  The calories you need.  The medicines you take.  Your weight.  Your blood glucose, blood pressure, and cholesterol levels.  Your activity level.  Other health conditions you have, such as heart or kidney disease. How do carbohydrates affect me? Carbohydrates, also called carbs, affect your blood glucose level more than any other type of food. Eating carbs naturally raises the amount of glucose in your blood. Carb counting is a method for keeping track of how many carbs you eat. Counting carbs is important to keep your blood glucose at a healthy level, especially if you use insulin or take certain oral diabetes medicines. It is important to know how many carbs you can safely have in each meal. This is different for every person. Your dietitian can help you calculate how many carbs you should have at each meal and for each snack. How does alcohol affect me? Alcohol can cause a sudden decrease in blood glucose (hypoglycemia), especially if you use insulin or take certain oral diabetes medicines. Hypoglycemia can be a life-threatening condition. Symptoms of hypoglycemia, such as sleepiness, dizziness, and confusion, are similar to symptoms of having too much  alcohol.  Do not drink alcohol if: ? Your health care provider tells you not to drink. ? You are pregnant, may be pregnant, or are planning to become pregnant.  If you drink alcohol: ? Do not drink on an empty stomach. ? Limit how much you use to:  0-1 drink a day for women.  0-2 drinks a day for men. ? Be aware of how much alcohol is in your drink. In the [place] one drink equals one 12 oz bottle of beer (355 mL), one 5 oz glass of wine (148 mL), or one 1 oz glass of hard liquor (44 mL). ? Keep yourself hydrated with water, diet soda, or unsweetened iced tea.  Keep in mind that regular soda, juice, and other mixers may contain a lot of sugar and must be counted as carbs. What are tips for following this plan? Reading food labels  Start by checking the serving size on the "Nutrition Facts" label of packaged foods and drinks. The amount of calories, carbs, fats, and other nutrients listed on the label is based on one serving of the item. Many items contain more than one serving per package.  Check the total grams (g) of carbs in one serving. You can calculate the number of servings of carbs in one serving by dividing the total carbs by 15. For example, if a food has 30 g of total carbs per serving, it would be equal to 2 servings of carbs.  Check the number of grams (g) of saturated fats and trans fats in one serving. Choose foods that have   a low amount or none of these fats.  Check the number of milligrams (mg) of salt (sodium) in one serving. Most people should limit total sodium intake to less than 2,300 mg per day.  Always check the nutrition information of foods labeled as "low-fat" or "nonfat." These foods may be higher in added sugar or refined carbs and should be avoided.  Talk to your dietitian to identify your daily goals for nutrients listed on the label. Shopping  Avoid buying canned, pre-made, or processed foods. These foods tend to be high in fat, sodium, and added  sugar.  Shop around the outside edge of the grocery store. This is where you will most often find fresh fruits and vegetables, bulk grains, fresh meats, and fresh dairy. Cooking  Use low-heat cooking methods, such as baking, instead of high-heat cooking methods like deep frying.  Cook using healthy oils, such as olive, canola, or sunflower oil.  Avoid cooking with butter, cream, or high-fat meats. Meal planning  Eat meals and snacks regularly, preferably at the same times every day. Avoid going long periods of time without eating.  Eat foods that are high in fiber, such as fresh fruits, vegetables, beans, and whole grains. Talk with your dietitian about how many servings of carbs you can eat at each meal.  Eat 4-6 oz (112-168 g) of lean protein each day, such as lean meat, chicken, fish, eggs, or tofu. One ounce (oz) of lean protein is equal to: ? 1 oz (28 g) of meat, chicken, or fish. ? 1 egg. ?  cup (62 g) of tofu.  Eat some foods each day that contain healthy fats, such as avocado, nuts, seeds, and fish.   What foods should I eat? Fruits Berries. Apples. Oranges. Peaches. Apricots. Plums. Grapes. Mango. Papaya. Pomegranate. Kiwi. Cherries. Vegetables Lettuce. Spinach. Leafy greens, including kale, chard, collard greens, and mustard greens. Beets. Cauliflower. Cabbage. Broccoli. Carrots. Green beans. Tomatoes. Peppers. Onions. Cucumbers. Brussels sprouts. Grains Whole grains, such as whole-wheat or whole-grain bread, crackers, tortillas, cereal, and pasta. Unsweetened oatmeal. Quinoa. Brown or wild rice. Meats and other proteins Seafood. Poultry without skin. Lean cuts of poultry and beef. Tofu. Nuts. Seeds. Dairy Low-fat or fat-free dairy products such as milk, yogurt, and cheese. The items listed above may not be a complete list of foods and beverages you can eat. Contact a dietitian for more information. What foods should I avoid? Fruits Fruits canned with  syrup. Vegetables Canned vegetables. Frozen vegetables with butter or cream sauce. Grains Refined white flour and flour products such as bread, pasta, snack foods, and cereals. Avoid all processed foods. Meats and other proteins Fatty cuts of meat. Poultry with skin. Breaded or fried meats. Processed meat. Avoid saturated fats. Dairy Full-fat yogurt, cheese, or milk. Beverages Sweetened drinks, such as soda or iced tea. The items listed above may not be a complete list of foods and beverages you should avoid. Contact a dietitian for more information. Questions to ask a health care provider  Do I need to meet with a diabetes educator?  Do I need to meet with a dietitian?  What number can I call if I have questions?  When are the best times to check my blood glucose? Where to find more information:  American Diabetes Association: diabetes.org  Academy of Nutrition and Dietetics: www.eatright.org  National Institute of Diabetes and Digestive and Kidney Diseases: www.niddk.nih.gov  Association of Diabetes Care and Education Specialists: www.diabeteseducator.org Summary  It is important to have healthy eating   habits because your blood sugar (glucose) levels are greatly affected by what you eat and drink.  A healthy meal plan will help you control your blood glucose and maintain a healthy lifestyle.  Your health care provider may recommend that you work with a dietitian to make a meal plan that is best for you.  Keep in mind that carbohydrates (carbs) and alcohol have immediate effects on your blood glucose levels. It is important to count carbs and to use alcohol carefully. This information is not intended to replace advice given to you by your health care provider. Make sure you discuss any questions you have with your health care provider. Document Revised: 02/07/2019 Document Reviewed: 02/07/2019 Elsevier Patient Education  2021 Elsevier Inc.  

## 2020-07-18 ENCOUNTER — Other Ambulatory Visit: Payer: Self-pay

## 2020-07-18 ENCOUNTER — Ambulatory Visit (INDEPENDENT_AMBULATORY_CARE_PROVIDER_SITE_OTHER): Payer: 59 | Admitting: Pharmacist

## 2020-07-18 DIAGNOSIS — F411 Generalized anxiety disorder: Secondary | ICD-10-CM

## 2020-07-18 DIAGNOSIS — E119 Type 2 diabetes mellitus without complications: Secondary | ICD-10-CM

## 2020-07-18 DIAGNOSIS — F320 Major depressive disorder, single episode, mild: Secondary | ICD-10-CM

## 2020-07-18 MED ORDER — RYBELSUS 7 MG PO TABS: 7.0000 mg | ORAL_TABLET | Freq: Every day | ORAL | 3 refills | Status: DC

## 2020-07-18 MED ORDER — FREESTYLE LIBRE 2 SENSOR MISC: 12 refills | Status: AC

## 2020-07-18 MED ORDER — METFORMIN HCL ER 500 MG PO TB24: 500.0000 mg | ORAL_TABLET | Freq: Every day | ORAL | 1 refills | Status: DC

## 2020-07-18 MED ORDER — ESCITALOPRAM OXALATE 10 MG PO TABS: 10.0000 mg | ORAL_TABLET | Freq: Every day | ORAL | 3 refills | Status: DC

## 2020-07-18 NOTE — Progress Notes (Signed)
    07/18/2020 Name: Jordan Barnett MRN: [id number] DOB: 01-Feb-1991   S:  11 yoM Presents for diabetes evaluation, education, and management Patient was referred and last seen by Primary Care Provider on 06/21/20.  Per PCP notes,  Insurance coverage/medication affordability: [employer] commercial  Patient denies adherence with medications. . Current diabetes medications include: metformin . Current hypertension medications include: lisinopril Goal 130/80 . Current hyperlipidemia medications include: rosuvastatin  Patient denies hypoglycemic events.   Patient reported dietary habits: Eats 3 meals/day The patient is asked to make an attempt to improve diet and exercise patterns to aid in medical management of this problem. Discussed meal planning options and Plate method for healthy eating . Avoid sugary drinks and desserts . Incorporate balanced protein, non starchy veggies, 1 serving of carbohydrate with each meal . Increase water intake . Increase physical activity as able . Drinks: water, some sweet tea Enjoys chicken and fish, working on pasta portions, incorporating more salads  Patient-reported exercise habits: N/A  Patient reports visual changes.     O:  Lab Results  Component Value Date   HGBA1C 6.5 (H) 06/21/2020      Lipid Panel     Component Value Date/Time   CHOL 179 04/12/2020 1118   TRIG 508 (H) 04/12/2020 1118   HDL 23 (L) 04/12/2020 1118   CHOLHDL 7.8 (H) 04/12/2020 1118   LDLCALC 75 04/12/2020 1118    Home fasting blood sugars:  114-117  2 hour post-meal/random blood sugars: 160-180    A/P:  Diabetes T2DM currently uncontrolled.  Patient is not adherent with medication. Control is suboptimal due to diet/lifestyle.  -STOP metformin  -Start Rybelsus 3mg  starter pack then increase to 7mg  daily  Denies history of thyroid cancer  -Extensively discussed pathophysiology of diabetes, recommended lifestyle interventions, dietary effects on blood sugar  control  -Counseled on s/sx of and management of hypoglycemia  -Next A1C anticipated 10/28/20 at PCP visit.  Written patient instructions provided.  Total time in face to face counseling 25 minutes.     , PharmD, BCPS Clinical Pharmacist, [facility]  II Phone [phone]

## 2020-07-25 ENCOUNTER — Encounter: Payer: Self-pay | Admitting: Family Medicine

## 2020-10-28 ENCOUNTER — Ambulatory Visit: Payer: 59 | Admitting: Family

## 2020-10-30 ENCOUNTER — Encounter: Payer: Self-pay | Admitting: Family

## 2020-12-09 ENCOUNTER — Telehealth: Payer: Self-pay | Admitting: Family

## 2020-12-09 ENCOUNTER — Ambulatory Visit (INDEPENDENT_AMBULATORY_CARE_PROVIDER_SITE_OTHER): Payer: 59 | Admitting: Family Medicine

## 2020-12-09 DIAGNOSIS — J40 Bronchitis, not specified as acute or chronic: Secondary | ICD-10-CM | POA: Diagnosis not present

## 2020-12-09 DIAGNOSIS — J069 Acute upper respiratory infection, unspecified: Secondary | ICD-10-CM

## 2020-12-09 LAB — VERITOR FLU A/B WAIVED
Influenza A: NEGATIVE
Influenza B: NEGATIVE

## 2020-12-09 MED ORDER — AZITHROMYCIN 250 MG PO TABS: ORAL_TABLET | ORAL | 0 refills | Status: DC

## 2020-12-09 MED ORDER — MOMETASONE FUROATE 50 MCG/ACT NA SUSP: 2.0000 | Freq: Every day | NASAL | 12 refills | Status: AC

## 2020-12-09 MED ORDER — BENZONATATE 100 MG PO CAPS: 100.0000 mg | ORAL_CAPSULE | Freq: Three times a day (TID) | ORAL | 0 refills | Status: DC | PRN

## 2020-12-09 NOTE — Patient Instructions (Signed)

## 2020-12-09 NOTE — Progress Notes (Signed)
Telephone visit  Subjective: CC: URI PCP: Sharion Balloon, FNP GNO:[id number] Widdowson is a 30 y.o. male calls for telephone consult today. Patient provides verbal consent for consult held via phone.  Due to COVID-19 pandemic this visit was conducted virtually. This visit type was conducted due to national recommendations for restrictions regarding the COVID-19 Pandemic (e.g. social distancing, sheltering in place) in an effort to limit this patient's exposure and mitigate transmission in our community. All issues noted in this document were discussed and addressed.  A physical exam was not performed with this format.   Location of patient: home Location of provider: [facility] Others present for call: none  1. URI Patient reports onset of nasal congestion, productive cough last thursday.  He is now having rib pain from coughing.  He has been having some rattling in his chest.  He has been tested by home test and is testing negative.  No history of asthma/ COPD.  Nonsmoker.  Does not work in [employer].  No fevers, myalgia, diarrhea.  No known sick contacts.   ROS: Per HPI  Allergies  Allergen Reactions   Fenofibrate Micronized     Sexual dysfunc   Past Medical History:  Diagnosis Date   Depression    Diabetes mellitus without complication (HCC)    High cholesterol    History of kidney stones    Hypertension    Hypothyroidism    Sleep apnea    mild   Thyroid disease     Current Outpatient Medications:    blood glucose meter kit and supplies, Dispense based on patient and insurance preference. Use up to four times daily as directed. (FOR ICD-10 E10.9, E11.9)., Disp: 1 each, Rfl: 0   Brimonidine Tartrate (LUMIFY) 0.025 % SOLN, Place 1 drop into both eyes daily as needed (redness)., Disp: , Rfl:    Continuous Blood Gluc Sensor (FREESTYLE LIBRE 2 SENSOR) MISC, Use to test blood sugars 4 times daily as directed., Disp: 2 each, Rfl: 12   escitalopram (LEXAPRO) 10 MG tablet, Take 1 tablet (10  mg total) by mouth daily., Disp: 90 tablet, Rfl: 3   levothyroxine (SYNTHROID) 100 MCG tablet, TAKE ONE (1) TABLET EACH DAY (Patient taking differently: Take 100 mcg by mouth daily.), Disp: 30 tablet, Rfl: 11   lisinopril (ZESTRIL) 10 MG tablet, Take 1 tablet (10 mg total) by mouth daily., Disp: 90 tablet, Rfl: 3   metFORMIN (GLUCOPHAGE XR) 500 MG 24 hr tablet, Take 1 tablet (500 mg total) by mouth daily with breakfast., Disp: 90 tablet, Rfl: 1   rosuvastatin (CRESTOR) 5 MG tablet, Take 1 tablet (5 mg total) by mouth daily., Disp: 90 tablet, Rfl: 1   Semaglutide (RYBELSUS) 7 MG TABS, Take 7 mg by mouth daily., Disp: 90 tablet, Rfl: 3  Assessment/ Plan: 30 y.o. male   Upper respiratory tract infection, unspecified type - Plan: Veritor Flu A/B Waived, Novel Coronavirus, NAA ([facility]), azithromycin (ZITHROMAX) 250 MG tablet, benzonatate (TESSALON PERLES) 100 MG capsule, mometasone (NASONEX) 50 MCG/ACT nasal spray  Bronchitis - Plan: azithromycin (ZITHROMAX) 250 MG tablet, benzonatate (TESSALON PERLES) 100 MG capsule  Will test for influenza, COVID-19.  Given progression of symptoms we will empirically treat for secondary bacterial infection with Z-Pak.  Tessalon Perles also sent.  Encourage p.o. hydration.  Work note provided excusing until patient's COVID-19 test comes back.  Home care instructions reviewed and reasons for return discussed.  Follow-up as needed  Start time: 8:38am End time: 8:44am  Total time spent on patient care (  including telephone call/ virtual visit): 6 minutes  [place], [place] [phone]

## 2020-12-10 LAB — SARS-COV-2, NAA 2 DAY TAT

## 2020-12-10 LAB — NOVEL CORONAVIRUS, NAA: SARS-CoV-2, NAA: DETECTED — AB

## 2021-01-23 ENCOUNTER — Other Ambulatory Visit: Payer: Self-pay | Admitting: Family

## 2021-01-30 ENCOUNTER — Telehealth: Payer: Self-pay | Admitting: Family

## 2021-02-03 NOTE — Telephone Encounter (Signed)
Pt aware update on FMLA, to hopefully have it back by lunchtime today from provider

## 2021-02-03 NOTE — Telephone Encounter (Signed)
Pt aware FMLA has been faxed

## 2021-02-17 ENCOUNTER — Encounter: Payer: Self-pay | Admitting: Family Medicine

## 2021-02-17 ENCOUNTER — Ambulatory Visit (INDEPENDENT_AMBULATORY_CARE_PROVIDER_SITE_OTHER): Payer: 59 | Admitting: Family Medicine

## 2021-02-17 DIAGNOSIS — J0100 Acute maxillary sinusitis, unspecified: Secondary | ICD-10-CM | POA: Diagnosis not present

## 2021-02-17 DIAGNOSIS — J069 Acute upper respiratory infection, unspecified: Secondary | ICD-10-CM

## 2021-02-17 MED ORDER — PSEUDOEPHEDRINE-GUAIFENESIN ER 120-1200 MG PO TB12: 1.0000 | ORAL_TABLET | Freq: Two times a day (BID) | ORAL | 0 refills | Status: DC

## 2021-02-17 MED ORDER — AMOXICILLIN-POT CLAVULANATE 875-125 MG PO TABS: 1.0000 | ORAL_TABLET | Freq: Two times a day (BID) | ORAL | 0 refills | Status: DC

## 2021-02-17 NOTE — Progress Notes (Signed)
Subjective:    Patient ID: Jordan Barnett, male    DOB: 1990-08-08, 30 y.o.   MRN: [id number]   HPI: Jordan Barnett is a 30 y.o. male presenting for congested/ ST. Left earache. No fever. Coughing with yellow sputum and PND. Has DOE. At home Covid test was negative. Onset 2-3 days ago.    Depression screen [facility] 2/9 07/16/2020 06/13/2020 04/12/2020 04/12/2020 11/23/2019  Decreased Interest 0 0 2 0 3  Down, Depressed, Hopeless 0 0 2 0 2  PHQ - 2 Score 0 0 4 0 5  Altered sleeping 0 - 1 - 1  Tired, decreased energy 0 - 3 - 2  Change in appetite 0 - 1 - 1  Feeling bad or failure about yourself  0 - 1 - 1  Trouble concentrating 0 - 2 - 0  Moving slowly or fidgety/restless 0 - 1 - 0  Suicidal thoughts 0 - 0 - 0  PHQ-9 Score 0 - 13 - 10  Difficult doing work/chores Not difficult at all - Not difficult at all - Somewhat difficult     Relevant past medical, surgical, family and social history reviewed and updated as indicated.  Interim medical history since our last visit reviewed. Allergies and medications reviewed and updated.  ROS:  Review of Systems  Constitutional:  Negative for activity change, appetite change, chills and fever.  HENT:  Positive for congestion, ear pain, postnasal drip, rhinorrhea and sinus pressure. Negative for ear discharge, hearing loss, nosebleeds, sneezing and trouble swallowing.   Respiratory:  Negative for chest tightness and shortness of breath.   Cardiovascular:  Negative for chest pain and palpitations.  Skin:  Negative for rash.    Social History   Tobacco Use  Smoking Status Former   Packs/day: 1.00   Years: 10.00   Pack years: 10.00   Types: Cigarettes   Quit date: 03/16/2013   Years since quitting: 7.9  Smokeless Tobacco Former   Types: Snuff   Quit date: 05/27/2020       Objective:     Wt Readings from Last 3 Encounters:  07/16/20 249 lb 3.2 oz (113 kg)  07/02/20 249 lb 9.6 oz (113.2 kg)  06/21/20 258 lb (117 kg)     Exam deferred.  Pt. Harboring due to COVID 19. Phone visit performed.   Assessment & Plan:   1. Acute maxillary sinusitis, recurrence not specified   2. Viral URI     Meds ordered this encounter  Medications   amoxicillin-clavulanate (AUGMENTIN) 875-125 MG tablet    Sig: Take 1 tablet by mouth 2 (two) times daily. Take all of this medication    Dispense:  20 tablet    Refill:  0   Pseudoephedrine-Guaifenesin [phone] MG TB12    Sig: Take 1 tablet by mouth 2 (two) times daily. For congestion    Dispense:  12 tablet    Refill:  0    Orders Placed This Encounter  Procedures   COVID-19, Flu A+B and RSV    Order Specific Question:   Previously tested for COVID-19    Answer:   Yes    Order Specific Question:   Resident in a congregate (group) care setting    Answer:   No    Order Specific Question:   Is the patient student?    Answer:   No    Order Specific Question:   Employed in healthcare setting    Answer:   No    Order Specific  Question:   Has patient completed COVID vaccination(s) (2 doses of Pfizer/Moderna 1 dose of [employer])    Answer:   Unknown    Order Specific Question:   Release to patient    Answer:   Immediate      Diagnoses and all orders for this visit:  Acute maxillary sinusitis, recurrence not specified  Viral URI -     COVID-19, Flu A+B and RSV  Other orders -     amoxicillin-clavulanate (AUGMENTIN) 875-125 MG tablet; Take 1 tablet by mouth 2 (two) times daily. Take all of this medication -     Pseudoephedrine-Guaifenesin [phone] MG TB12; Take 1 tablet by mouth 2 (two) times daily. For congestion   Virtual Visit via telephone Note  I discussed the limitations, risks, security and privacy concerns of performing an evaluation and management service by telephone and the availability of in person appointments. The patient was identified with two identifiers. Pt.expressed understanding and agreed to proceed. Pt. Is at home. Dr. Darlyn Read is in his office.  Follow  Up Instructions:   I discussed the assessment and treatment plan with the patient. The patient was provided an opportunity to ask questions and all were answered. The patient agreed with the plan and demonstrated an understanding of the instructions.   The patient was advised to call back or seek an in-person evaluation if the symptoms worsen or if the condition fails to improve as anticipated.   Total minutes including phone contact time: 13   Follow up plan: Return if symptoms worsen or fail to improve.  Mechele Claude, MD [place] [facility]

## 2021-03-13 ENCOUNTER — Telehealth: Payer: Self-pay | Admitting: Family

## 2021-03-13 ENCOUNTER — Other Ambulatory Visit: Payer: Self-pay | Admitting: Family

## 2021-03-13 DIAGNOSIS — E039 Hypothyroidism, unspecified: Secondary | ICD-10-CM

## 2021-03-13 MED ORDER — LEVOTHYROXINE SODIUM 100 MCG PO TABS: 100.0000 ug | ORAL_TABLET | Freq: Every day | ORAL | 1 refills | Status: AC

## 2021-03-13 NOTE — Progress Notes (Signed)
Prescription sent to pharmacy.

## 2021-03-19 ENCOUNTER — Telehealth: Payer: Self-pay | Admitting: Family

## 2021-03-19 DIAGNOSIS — E1169 Type 2 diabetes mellitus with other specified complication: Secondary | ICD-10-CM

## 2021-03-19 DIAGNOSIS — F320 Major depressive disorder, single episode, mild: Secondary | ICD-10-CM

## 2021-03-19 DIAGNOSIS — E785 Hyperlipidemia, unspecified: Secondary | ICD-10-CM

## 2021-03-19 DIAGNOSIS — F411 Generalized anxiety disorder: Secondary | ICD-10-CM

## 2021-03-19 MED ORDER — ESCITALOPRAM OXALATE 10 MG PO TABS: 10.0000 mg | ORAL_TABLET | Freq: Every day | ORAL | 0 refills | Status: AC

## 2021-03-19 MED ORDER — METFORMIN HCL ER 500 MG PO TB24: 500.0000 mg | ORAL_TABLET | Freq: Every day | ORAL | 0 refills | Status: AC

## 2021-03-19 MED ORDER — ROSUVASTATIN CALCIUM 5 MG PO TABS: 5.0000 mg | ORAL_TABLET | Freq: Every day | ORAL | 0 refills | Status: AC

## 2021-03-19 MED ORDER — LISINOPRIL 10 MG PO TABS: 10.0000 mg | ORAL_TABLET | Freq: Every day | ORAL | 0 refills | Status: AC

## 2021-03-19 MED ORDER — RYBELSUS 7 MG PO TABS: 7.0000 mg | ORAL_TABLET | Freq: Every day | ORAL | 0 refills | Status: AC

## 2021-03-19 NOTE — Telephone Encounter (Signed)
°  Prescription Request  03/19/2021  Is this a "Controlled Substance" medicine? no  Have you seen your PCP in the last 2 weeks? No, pt moved and needs it sent to different pharmacy   If YES, route message to pool  -  If NO, patient needs to be scheduled for appointment.  What is the name of the medication or equipment? metFORMIN (GLUCOPHAGE XR) 500 MG 24 hr tablet  Have you contacted your pharmacy to request a refill? No, needs sent to a different pharmacy    Which pharmacy would you like this sent to? [facility] - [place], [place] - [address]   Patient notified that their request is being sent to the clinical staff for review and that they should receive a response within 2 business days.

## 2021-03-19 NOTE — Telephone Encounter (Signed)
Pt aware that 30 days was sent in on all of his meds to the pharmacy in [place]

## 2022-07-12 IMAGING — DX DG CHEST 1V PORT
1 series · 1 of 1 positions shown · non-contrast
Comparison: September 12, 2013.

CLINICAL DATA: Right pneumothorax.

EXAM:
PORTABLE CHEST 1 VIEW

[chest ap]
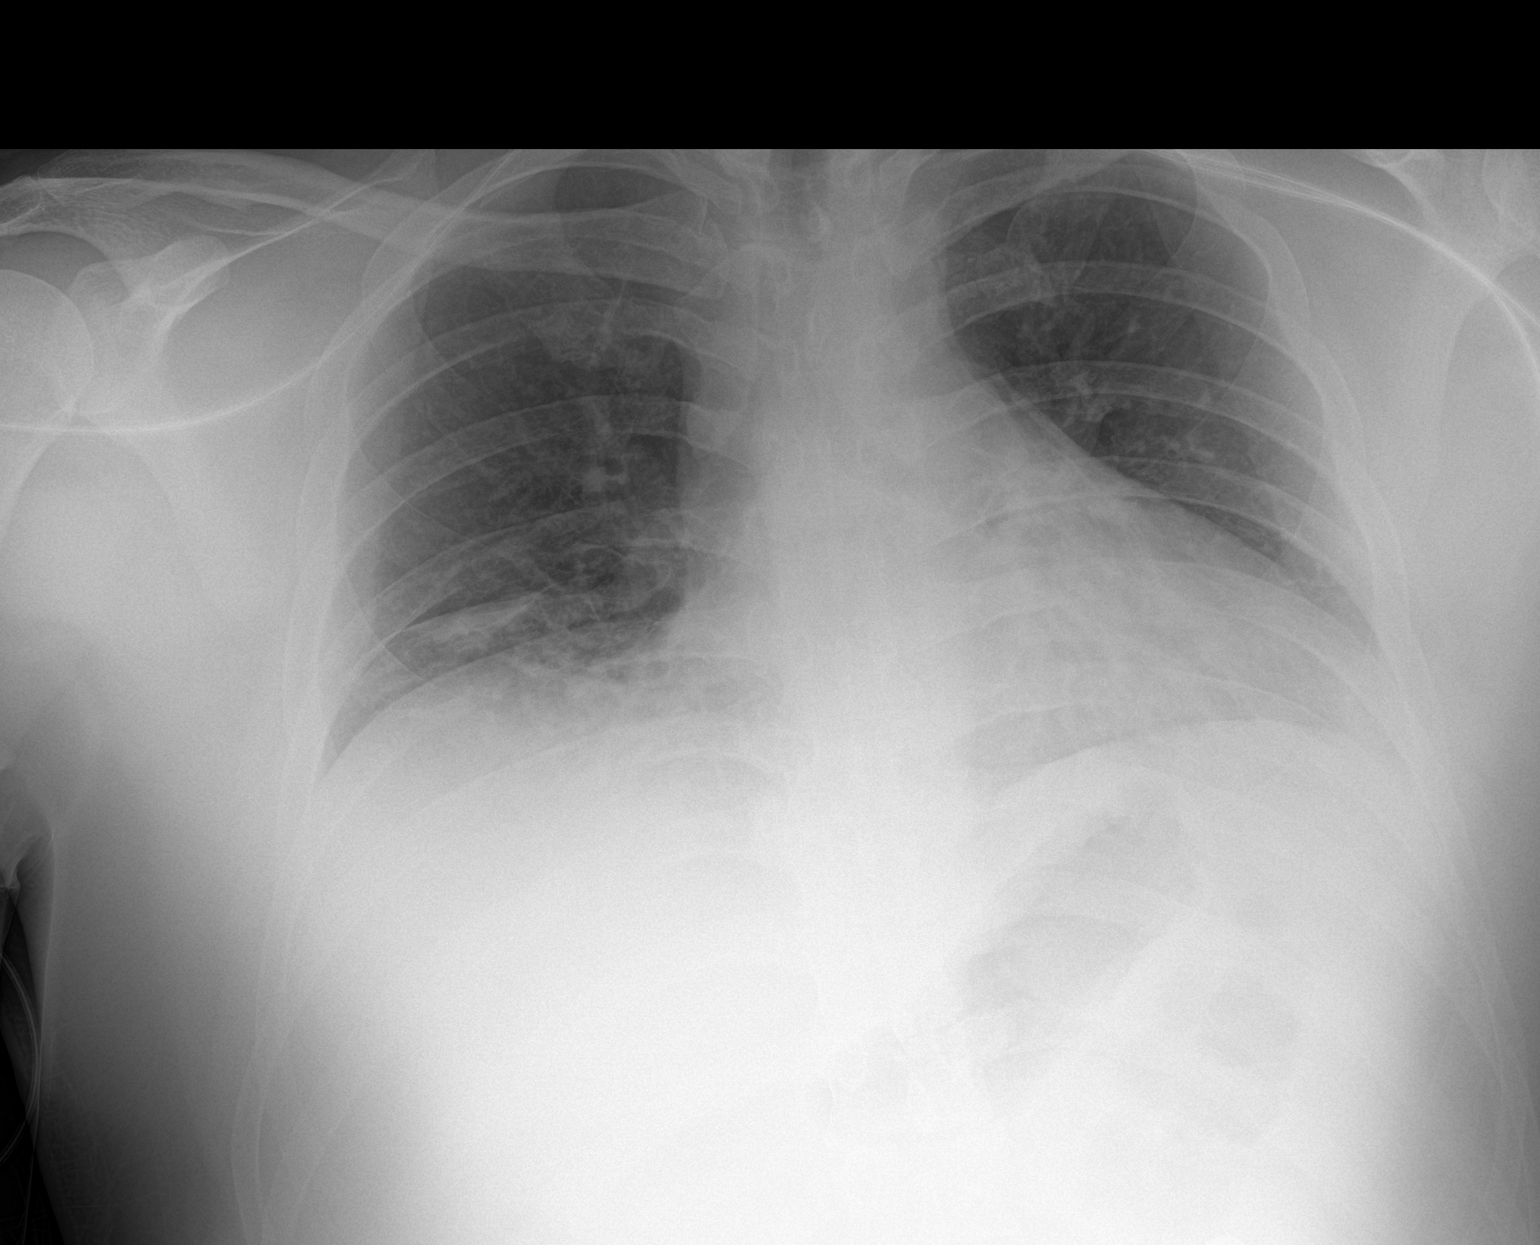

[1 of 1 positions shown; findings below may reference images not displayed]

FINDINGS: Stable cardiomediastinal silhouette. No pneumothorax or pleural
effusion is noted. Left lung is clear. Mild right basilar
subsegmental atelectasis is noted. Bony thorax is unremarkable.
IMPRESSION: No definite pneumothorax is noted. Mild right basilar subsegmental
atelectasis is noted.

## 2022-07-12 IMAGING — XA IR URETURAL STENT RIGHT NEW ACCESS W/O SEP NEPHROSTOMY CATH
2 series · 10 of 10 positions shown · non-contrast
Comparison: 05/09/2020

INDICATION: Obstructing right UPJ stone

EXAM:
ULTRASOUND and FLUOROSCOPIC RIGHT NEPHROSTOMY ACCESS FOR OPERATIVE
NEPHROLITHOTOMY

[Series 2: fl - angio · 4 of 66 frames shown]
[frame 10/66]
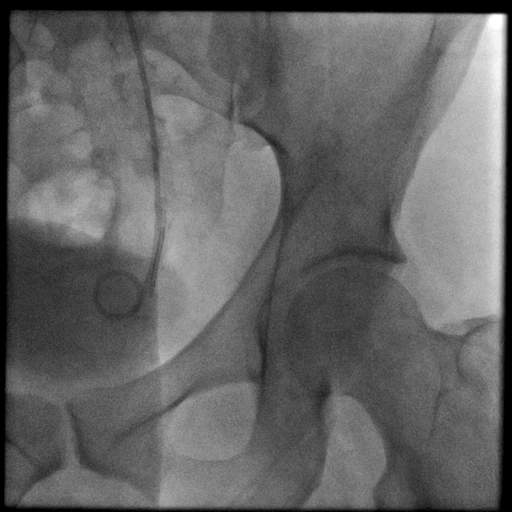
[frame 25/66]
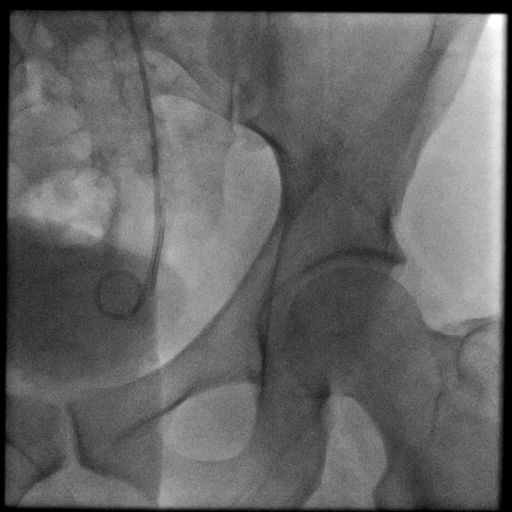
[frame 34/66]
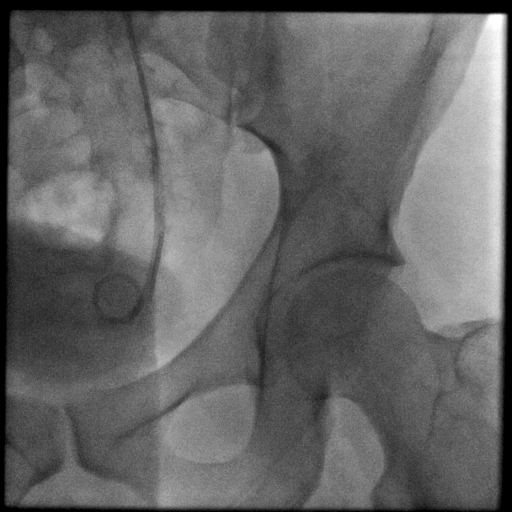
[frame 57/66]
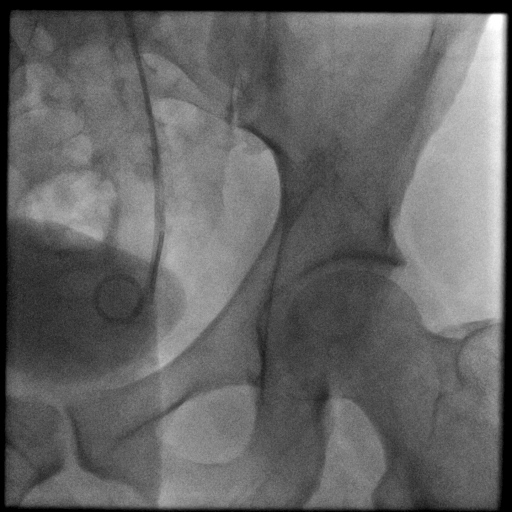

[Series 300: tube placements · 6 of 6 slices shown]
[im 1/6]
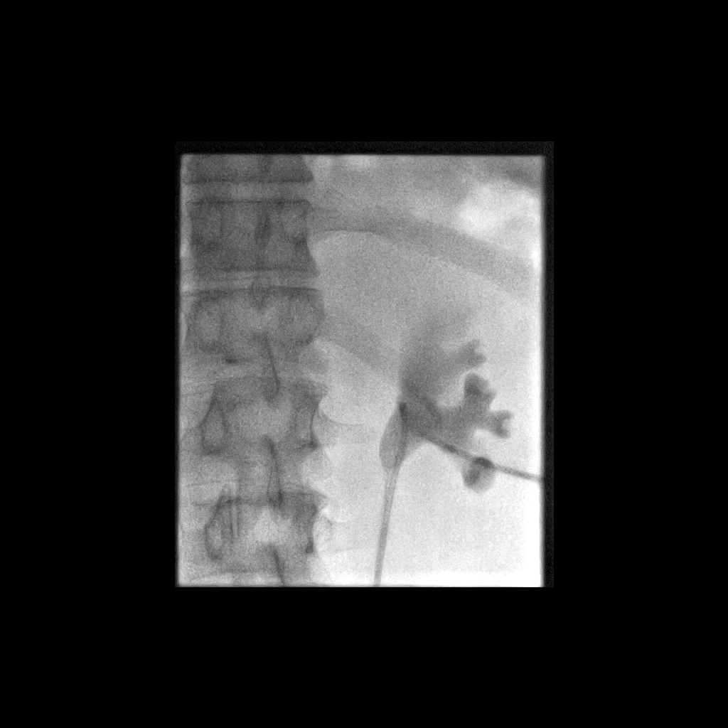
[im 2/6]
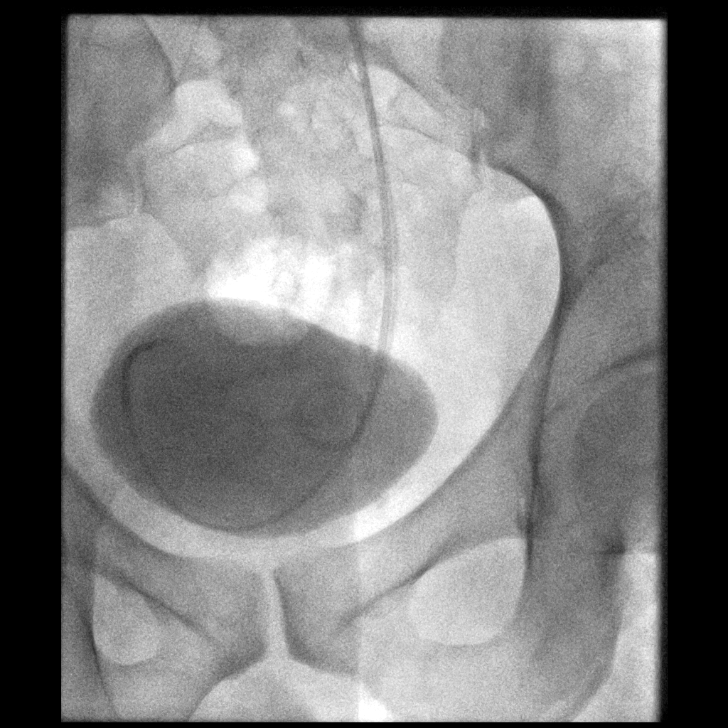
[im 3/6]
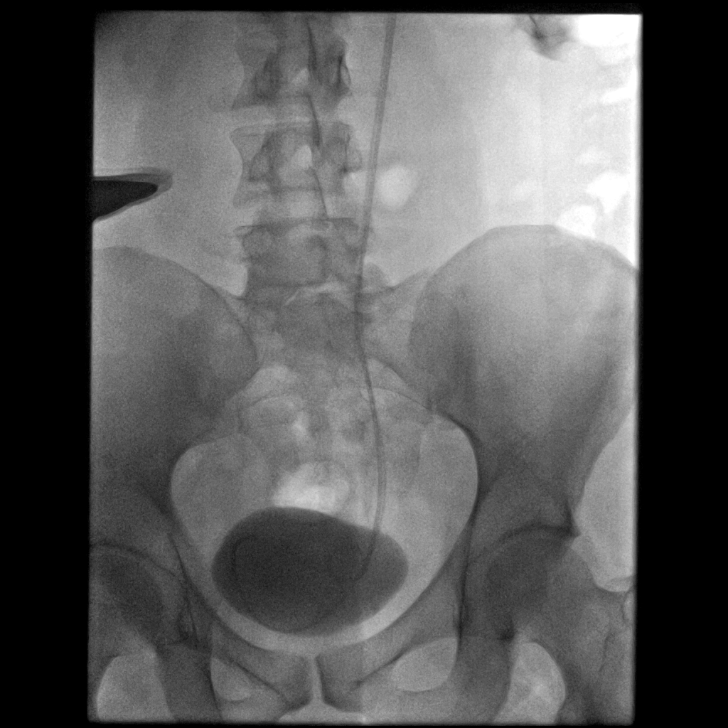
[im 4/6]
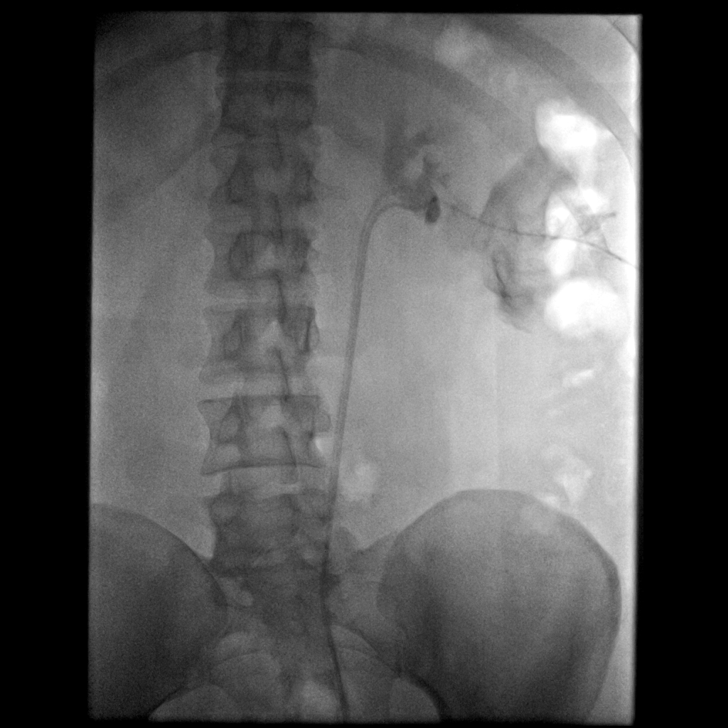
[im 5/6]
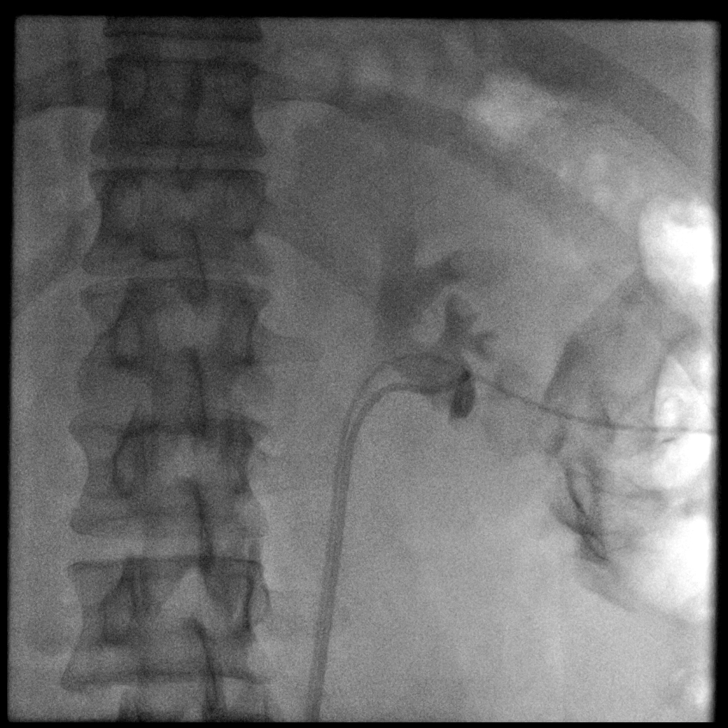
[im 6/6]
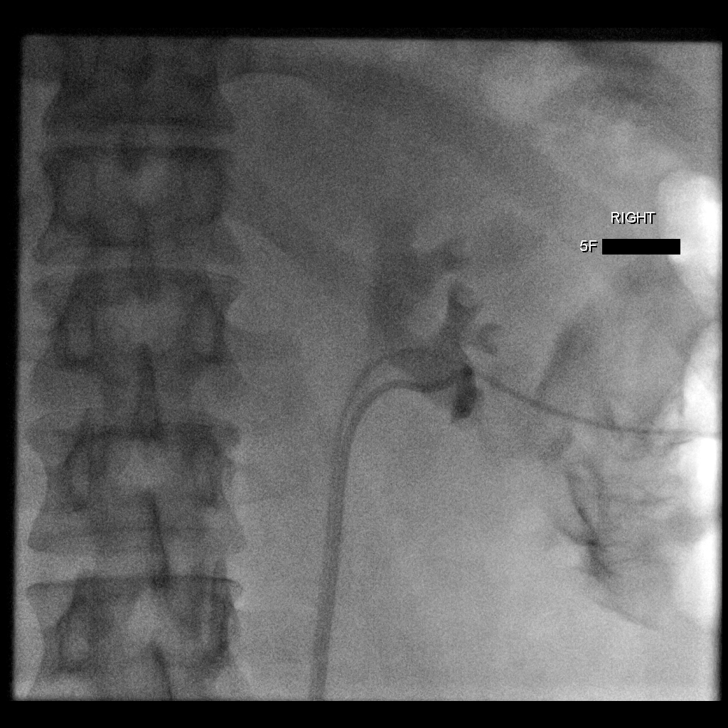

[10 of 10 positions shown; findings below may reference images not displayed]

MEDICATIONS:
2 G ROCEPHIN; The antibiotic was administered in an appropriate time
frame prior to skin puncture.

ANESTHESIA/SEDATION:
Fentanyl 100 mcg IV; Versed 3.0 mg IV

Moderate Sedation Time:  24 MINUTES

The patient was continuously monitored during the procedure by the
interventional radiology nurse under my direct supervision.

CONTRAST:  20 cc-administered into the collecting system(s)

FLUOROSCOPY TIME:  Fluoroscopy Time: 11 minutes 42 seconds (263
mGy).

COMPLICATIONS:
None immediate.

PROCEDURE:
Informed written consent was obtained from the patient after a
thorough discussion of the procedural risks, benefits and
alternatives. All questions were addressed. Maximal Sterile Barrier
Technique was utilized including caps, mask, sterile gowns, sterile
gloves, sterile drape, hand hygiene and skin antiseptic. A timeout
was performed prior to the initiation of the procedure.

Previous imaging reviewed. Patient positioned prone. Preliminary
ultrasound performed. The right kidney was localized and marked.

Under sterile conditions and local anesthesia, 21 gauge needle was
advanced into a dilated mid to lower pole calyx. There was return of
clear urine. Needle position confirmed with ultrasound. Images
obtained for documentation. 018 guidewire advanced followed by the
transitional dilator set.

Bentson guidewire coiled in the upper pole dilated collecting
system. Kumpe catheter advanced.

With a Glidewire, access was eventually advanced past the
obstructing UPJ calculus and the internal ureteral stent. Amplatz
guidewire and catheter access advanced into the bladder. Contrast
injection confirms position in the bladder. Images obtained for
documentation.
IMPRESSION: Successful ultrasound fluoroscopic right nephrostomy access advanced
into the bladder for operative nephrolithotomy later today.

## 2022-08-05 ENCOUNTER — Other Ambulatory Visit (HOSPITAL_BASED_OUTPATIENT_CLINIC_OR_DEPARTMENT_OTHER): Payer: Self-pay

## 2022-08-05 MED ORDER — MOUNJARO 2.5 MG/0.5ML ~~LOC~~ SOAJ
2.5000 mg | SUBCUTANEOUS | 0 refills | Status: AC
  Filled 2022-08-05: qty 2, 28d supply, fill #0

## 2022-08-05 MED ORDER — FREESTYLE LIBRE 3 SENSOR MISC
5 refills | Status: AC
  Filled 2022-08-05: qty 1, 14d supply, fill #0

## 2022-08-06 ENCOUNTER — Other Ambulatory Visit (HOSPITAL_BASED_OUTPATIENT_CLINIC_OR_DEPARTMENT_OTHER): Payer: Self-pay

## 2022-08-07 ENCOUNTER — Other Ambulatory Visit (HOSPITAL_BASED_OUTPATIENT_CLINIC_OR_DEPARTMENT_OTHER): Payer: Self-pay

## 2022-08-12 ENCOUNTER — Other Ambulatory Visit (HOSPITAL_BASED_OUTPATIENT_CLINIC_OR_DEPARTMENT_OTHER): Payer: Self-pay

## 2022-08-23 ENCOUNTER — Other Ambulatory Visit (HOSPITAL_BASED_OUTPATIENT_CLINIC_OR_DEPARTMENT_OTHER): Payer: Self-pay

## 2022-08-26 ENCOUNTER — Other Ambulatory Visit (HOSPITAL_BASED_OUTPATIENT_CLINIC_OR_DEPARTMENT_OTHER): Payer: Self-pay

## 2022-09-02 ENCOUNTER — Other Ambulatory Visit (HOSPITAL_BASED_OUTPATIENT_CLINIC_OR_DEPARTMENT_OTHER): Payer: Self-pay

## 2022-09-04 ENCOUNTER — Other Ambulatory Visit (HOSPITAL_BASED_OUTPATIENT_CLINIC_OR_DEPARTMENT_OTHER): Payer: Self-pay

## 2022-09-07 ENCOUNTER — Other Ambulatory Visit (HOSPITAL_BASED_OUTPATIENT_CLINIC_OR_DEPARTMENT_OTHER): Payer: Self-pay

## 2022-09-14 ENCOUNTER — Other Ambulatory Visit (HOSPITAL_BASED_OUTPATIENT_CLINIC_OR_DEPARTMENT_OTHER): Payer: Self-pay

## 2022-10-04 ENCOUNTER — Other Ambulatory Visit (HOSPITAL_BASED_OUTPATIENT_CLINIC_OR_DEPARTMENT_OTHER): Payer: Self-pay

## 2022-10-10 ENCOUNTER — Other Ambulatory Visit (HOSPITAL_BASED_OUTPATIENT_CLINIC_OR_DEPARTMENT_OTHER): Payer: Self-pay

## 2022-10-15 ENCOUNTER — Other Ambulatory Visit (HOSPITAL_BASED_OUTPATIENT_CLINIC_OR_DEPARTMENT_OTHER): Payer: Self-pay

## 2023-04-27 ENCOUNTER — Other Ambulatory Visit (HOSPITAL_BASED_OUTPATIENT_CLINIC_OR_DEPARTMENT_OTHER): Payer: Self-pay

## 2023-04-27 MED ORDER — MOUNJARO 10 MG/0.5ML ~~LOC~~ SOAJ
10.0000 mg | SUBCUTANEOUS | 0 refills | Status: DC
  Filled 2023-04-27: qty 2, 28d supply, fill #0
